# Patient Record
Sex: Female | Born: 1960 | Race: White | Hispanic: No | Marital: Married | Smoking: Former smoker
Health system: Southern US, Community
[De-identification: ages and names within clinical notes are randomized; demographics above are authoritative.]

## PROBLEM LIST (undated history)

## (undated) DIAGNOSIS — F32A Depression, unspecified: Secondary | ICD-10-CM

## (undated) DIAGNOSIS — F419 Anxiety disorder, unspecified: Secondary | ICD-10-CM

## (undated) DIAGNOSIS — E039 Hypothyroidism, unspecified: Secondary | ICD-10-CM

## (undated) DIAGNOSIS — I499 Cardiac arrhythmia, unspecified: Secondary | ICD-10-CM

## (undated) DIAGNOSIS — F329 Major depressive disorder, single episode, unspecified: Secondary | ICD-10-CM

## (undated) DIAGNOSIS — M1990 Unspecified osteoarthritis, unspecified site: Secondary | ICD-10-CM

---

## 2000-09-18 ENCOUNTER — Emergency Department (HOSPITAL_COMMUNITY): Admission: EM | Admit: 2000-09-18 | Discharge: 2000-09-18 | Payer: Self-pay | Admitting: Emergency Medicine

## 2001-08-13 ENCOUNTER — Encounter: Payer: Self-pay | Admitting: Family Medicine

## 2001-08-13 ENCOUNTER — Ambulatory Visit (HOSPITAL_COMMUNITY): Admission: RE | Admit: 2001-08-13 | Discharge: 2001-08-13 | Payer: Self-pay | Admitting: Family Medicine

## 2006-12-21 ENCOUNTER — Ambulatory Visit (HOSPITAL_COMMUNITY): Admission: RE | Admit: 2006-12-21 | Discharge: 2006-12-21 | Payer: Self-pay | Admitting: Family Medicine

## 2007-02-28 HISTORY — PX: CARDIAC CATHETERIZATION: SHX172

## 2007-05-17 ENCOUNTER — Observation Stay (HOSPITAL_COMMUNITY): Admission: AD | Admit: 2007-05-17 | Discharge: 2007-05-18 | Payer: Self-pay | Admitting: Cardiovascular Disease

## 2007-05-31 ENCOUNTER — Ambulatory Visit (HOSPITAL_COMMUNITY): Payer: Self-pay | Admitting: Oncology

## 2007-05-31 ENCOUNTER — Encounter (HOSPITAL_COMMUNITY): Admission: RE | Admit: 2007-05-31 | Discharge: 2007-06-30 | Payer: Self-pay | Admitting: Oncology

## 2008-09-08 ENCOUNTER — Ambulatory Visit (HOSPITAL_COMMUNITY): Admission: RE | Admit: 2008-09-08 | Discharge: 2008-09-08 | Payer: Self-pay | Admitting: Family Medicine

## 2008-09-09 ENCOUNTER — Ambulatory Visit (HOSPITAL_COMMUNITY): Admission: RE | Admit: 2008-09-09 | Discharge: 2008-09-09 | Payer: Self-pay | Admitting: Family Medicine

## 2008-09-16 ENCOUNTER — Ambulatory Visit (HOSPITAL_COMMUNITY): Admission: RE | Admit: 2008-09-16 | Discharge: 2008-09-16 | Payer: Self-pay | Admitting: Family Medicine

## 2010-07-12 NOTE — Cardiovascular Report (Signed)
Teresa Charles, Teresa Charles                ACCOUNT NO.:  [id number]   MEDICAL RECORD NO.:  [id number]          PATIENT TYPE:  INP   LOCATION:  6526                         FACILITY:  [facility]   PHYSICIAN:  Nanetta Batty, M.D.   DATE OF BIRTH:  [birth year]   DATE OF PROCEDURE:  DATE OF DISCHARGE:                            CARDIAC CATHETERIZATION   RESULTS:  Teresa Charles is a 50 year old moderately overweight married  white female with history of tobacco abuse who was admitted this morning  with symptoms compatible with unstable angina.  She had no acute changes  on her EKG.  She presents now for diagnostic coronary arteriography to  define her anatomy and rule out an ischemic etiology.   DESCRIPTION OF PROCEDURE:  The patient was brought to the second floor  [place] cardiac cath lab in the postabsorptive state.  She was  premedicated with p.o. Valium.  Her right groin was prepped and draped  in the usual sterile fashion, and 1% Xylocaine was used for local  anesthesia.  A 6-French sheath was inserted into the right femoral  artery using standard Seldinger technique.  A 6-French right and left  Judkins diagnostic catheter as well as a 6-French pigtail catheter were  used for selective coronary angiography and left ventriculography  respectively.  Visipaque dye was used for the entirety of the case.  Retrograde aorta, left ventricular blood pressures were recorded.   HEMODYNAMICS:  1. Aortic systolic pressure 146, diastolic pressure 76.  2. Left ventricular systolic pressure 153, end-diastolic pressure 13.   SELECTIVE CORONARY ANGIOGRAPHY:  1. Left main normal.  2. LAD normal.  3. Left circumflex normal.  4. Right coronary artery was dominant and normal.   LEFT VENTRICULOGRAPHY:  RAO left ventriculogram was performed using 25  mL of Visipaque dye at 12 mL per second.  The overall LVEF was estimated  at greater than 60% without focal wall motion abnormalities.   IMPRESSION:  [name] has essentially normal coronaries, normal LV  function.  I believe her chest pain is noncardiac.  A D-dimer was  ordered.  Empiric antireflux therapy will be recommended.  The patient  will be discharged home in the morning.  She left the lab in stable  condition.      Nanetta Batty, M.D.  Electronically Signed     JB/MEDQ  D:  05/17/2007  T:  05/17/2007  Job:  [id number]   cc:   [facility]  Dr. Geanie Cooley

## 2010-10-24 ENCOUNTER — Other Ambulatory Visit (HOSPITAL_COMMUNITY): Payer: Self-pay | Admitting: Family Medicine

## 2010-10-24 ENCOUNTER — Ambulatory Visit (HOSPITAL_COMMUNITY)
Admission: RE | Admit: 2010-10-24 | Discharge: 2010-10-24 | Disposition: A | Discharge status: Home / Self Care | Payer: BC Managed Care – PPO | Source: Ambulatory Visit | Attending: Family Medicine | Admitting: Family Medicine

## 2010-10-24 DIAGNOSIS — S92919A Unspecified fracture of unspecified toe(s), initial encounter for closed fracture: Secondary | ICD-10-CM | POA: Insufficient documentation

## 2010-10-24 DIAGNOSIS — T148XXA Other injury of unspecified body region, initial encounter: Secondary | ICD-10-CM

## 2010-10-24 DIAGNOSIS — X58XXXA Exposure to other specified factors, initial encounter: Secondary | ICD-10-CM | POA: Insufficient documentation

## 2010-10-24 DIAGNOSIS — M25579 Pain in unspecified ankle and joints of unspecified foot: Secondary | ICD-10-CM | POA: Insufficient documentation

## 2010-11-21 LAB — CBC
HCT: 42.2
Hemoglobin: 14.2
MCHC: 33.6
MCV: 90.9
MCV: 91.4
Platelets: 224
RBC: 4.65
RDW: 14.3
WBC: 17 — ABNORMAL HIGH

## 2010-11-21 LAB — DIFFERENTIAL
Basophils Relative: 0
Eosinophils Absolute: 0.2
Eosinophils Relative: 1
Monocytes Absolute: 0.5
Monocytes Relative: 4
Neutrophils Relative %: 76

## 2010-11-21 LAB — COMPREHENSIVE METABOLIC PANEL
ALT: 14
AST: 17
Albumin: 3.3 — ABNORMAL LOW
Calcium: 9.1
Creatinine, Ser: 0.56
GFR calc Af Amer: >60
Sodium: 139
Total Protein: 6.1

## 2010-11-21 LAB — AMYLASE: Amylase: 51

## 2010-11-21 LAB — LIPID PANEL
Cholesterol: 181
HDL: 29 — ABNORMAL LOW
Total CHOL/HDL Ratio: 6.2

## 2010-11-21 LAB — HEPARIN LEVEL (UNFRACTIONATED): Heparin Unfractionated: <0.1 — ABNORMAL LOW

## 2010-11-21 LAB — URINALYSIS, MICROSCOPIC ONLY
Glucose, UA: NEGATIVE
Ketones, ur: NEGATIVE
Protein, ur: NEGATIVE

## 2010-11-21 LAB — CARDIAC PANEL(CRET KIN+CKTOT+MB+TROPI)
CK, MB: 0.6
Relative Index: INVALID
Relative Index: INVALID
Total CK: 32
Troponin I: 0.01
Troponin I: 0.01

## 2010-11-21 LAB — HCG, SERUM, QUALITATIVE: Preg, Serum: NEGATIVE

## 2010-11-21 LAB — T4, FREE: Free T4: 1.01

## 2010-11-22 LAB — DIFFERENTIAL
Basophils Absolute: 0.1
Lymphocytes Relative: 22
Monocytes Absolute: 0.6
Neutro Abs: 10.6 — ABNORMAL HIGH
Neutrophils Relative %: 72

## 2010-11-22 LAB — CBC
Hemoglobin: 14.7
RBC: 4.65
RDW: 14.4

## 2010-12-15 ENCOUNTER — Other Ambulatory Visit (HOSPITAL_COMMUNITY): Payer: Self-pay | Admitting: Family Medicine

## 2010-12-15 DIAGNOSIS — Z139 Encounter for screening, unspecified: Secondary | ICD-10-CM

## 2010-12-16 ENCOUNTER — Ambulatory Visit (HOSPITAL_COMMUNITY)
Admission: RE | Admit: 2010-12-16 | Discharge: 2010-12-16 | Disposition: A | Discharge status: Home / Self Care | Payer: BC Managed Care – PPO | Source: Ambulatory Visit | Attending: Family Medicine | Admitting: Family Medicine

## 2010-12-16 DIAGNOSIS — Z139 Encounter for screening, unspecified: Secondary | ICD-10-CM

## 2010-12-16 DIAGNOSIS — Z1231 Encounter for screening mammogram for malignant neoplasm of breast: Secondary | ICD-10-CM | POA: Insufficient documentation

## 2011-08-28 ENCOUNTER — Other Ambulatory Visit (HOSPITAL_COMMUNITY): Payer: Self-pay | Admitting: Family Medicine

## 2011-08-28 ENCOUNTER — Ambulatory Visit (HOSPITAL_COMMUNITY)
Admission: RE | Admit: 2011-08-28 | Discharge: 2011-08-28 | Disposition: A | Discharge status: Home / Self Care | Payer: BC Managed Care – PPO | Source: Ambulatory Visit | Attending: Family Medicine | Admitting: Family Medicine

## 2011-08-28 DIAGNOSIS — Q766 Other congenital malformations of ribs: Secondary | ICD-10-CM

## 2011-08-28 DIAGNOSIS — W19XXXA Unspecified fall, initial encounter: Secondary | ICD-10-CM | POA: Insufficient documentation

## 2011-08-28 DIAGNOSIS — M549 Dorsalgia, unspecified: Secondary | ICD-10-CM

## 2011-08-28 DIAGNOSIS — S2239XA Fracture of one rib, unspecified side, initial encounter for closed fracture: Secondary | ICD-10-CM | POA: Insufficient documentation

## 2011-08-28 DIAGNOSIS — R079 Chest pain, unspecified: Secondary | ICD-10-CM | POA: Insufficient documentation

## 2012-01-29 ENCOUNTER — Other Ambulatory Visit (HOSPITAL_COMMUNITY): Payer: Self-pay | Admitting: Family Medicine

## 2012-01-29 DIAGNOSIS — Z139 Encounter for screening, unspecified: Secondary | ICD-10-CM

## 2012-02-05 ENCOUNTER — Ambulatory Visit (HOSPITAL_COMMUNITY)
Admission: RE | Admit: 2012-02-05 | Discharge: 2012-02-05 | Disposition: A | Discharge status: Home / Self Care | Payer: BC Managed Care – PPO | Source: Ambulatory Visit | Attending: Family Medicine | Admitting: Family Medicine

## 2012-02-05 DIAGNOSIS — Z139 Encounter for screening, unspecified: Secondary | ICD-10-CM

## 2012-02-05 DIAGNOSIS — Z1231 Encounter for screening mammogram for malignant neoplasm of breast: Secondary | ICD-10-CM | POA: Insufficient documentation

## 2013-02-10 ENCOUNTER — Other Ambulatory Visit (HOSPITAL_COMMUNITY): Payer: Self-pay | Admitting: Family Medicine

## 2013-02-10 DIAGNOSIS — Z139 Encounter for screening, unspecified: Secondary | ICD-10-CM

## 2013-02-17 ENCOUNTER — Ambulatory Visit (HOSPITAL_COMMUNITY)
Admission: RE | Admit: 2013-02-17 | Discharge: 2013-02-17 | Disposition: A | Discharge status: Home / Self Care | Payer: PRIVATE HEALTH INSURANCE | Source: Ambulatory Visit | Attending: Family Medicine | Admitting: Family Medicine

## 2013-02-17 DIAGNOSIS — Z139 Encounter for screening, unspecified: Secondary | ICD-10-CM

## 2013-02-17 DIAGNOSIS — Z1231 Encounter for screening mammogram for malignant neoplasm of breast: Secondary | ICD-10-CM | POA: Insufficient documentation

## 2013-11-24 ENCOUNTER — Encounter (HOSPITAL_COMMUNITY): Payer: Self-pay | Admitting: Pharmacy Technician

## 2013-11-27 NOTE — Patient Instructions (Addendum)
Teresa Charles  11/27/2013                           YOUR PROCEDURE IS SCHEDULED ON: 12/08/13               ENTER THRU [facility] MAIN HOSPITAL ENTRANCE AND                            FOLLOW  SIGNS TO SHORT STAY CENTER                 ARRIVE AT SHORT STAY AT:  5:00 AM               CALL THIS NUMBER IF ANY PROBLEMS THE DAY OF SURGERY :               [phone]                                REMEMBER:   Do not eat food or drink liquids AFTER MIDNIGHT                  Take these medicines the morning of surgery with               A SIPS OF WATER :      Metoprolol, Levothyroxine, Wellbutrin   Do not wear jewelry, make-up   Do not wear lotions, powders, or perfumes.   Do not shave legs or underarms 12 hrs. before surgery (men may shave face)  Do not bring valuables to the hospital.  Contacts, dentures or bridgework may not be worn into surgery.  Leave suitcase in the car. After surgery it may be brought to your room.  For patients admitted to the hospital more than one night, checkout time is            11:00 AM                                                       ________________________________________________________________________                                                                                                   - PREPARING FOR SURGERY  Before surgery, you can play an important role.  Because skin is not sterile, your skin needs to be as free of germs as possible.  You can reduce the number of germs on your skin by washing with CHG (chlorahexidine gluconate) soap before surgery.  CHG is an antiseptic cleaner which kills germs and bonds with the skin to continue killing germs even after washing. Please DO NOT use if you have an allergy to CHG or antibacterial soaps.  If your skin becomes reddened/irritated stop using the CHG and inform your nurse when you  arrive at Short Stay. Do not shave (including legs and underarms) for at least 48  hours prior to the first CHG shower.  You may shave your face. Please follow these instructions carefully:   1.  Shower with CHG Soap the night before surgery and the  morning of Surgery.   2.  If you choose to wash your hair, wash your hair first as usual with your  normal  Shampoo.   3.  After you shampoo, rinse your hair and body thoroughly to remove the  shampoo.                                         4.  Use CHG as you would any other liquid soap.  You can apply chg directly  to the skin and wash . Gently wash with scrungie or clean wascloth    5.  Apply the CHG Soap to your body ONLY FROM THE NECK DOWN.   Do not use on open                           Wound or open sores. Avoid contact with eyes, ears mouth and genitals (private parts).                        Genitals (private parts) with your normal soap.              6.  Wash thoroughly, paying special attention to the area where your surgery  will be performed.   7.  Thoroughly rinse your body with warm water from the neck down.   8.  DO NOT shower/wash with your normal soap after using and rinsing off  the CHG Soap .                9.  Pat yourself dry with a clean towel.             10.  Wear clean pajamas.             11.  Place clean sheets on your bed the night of your first shower and do not  sleep with pets.  Day of Surgery : Do not apply any lotions/deodorants the morning of surgery.  Please wear clean clothes to the hospital/surgery center.  FAILURE TO FOLLOW THESE INSTRUCTIONS MAY RESULT IN THE CANCELLATION OF YOUR SURGERY    PATIENT SIGNATURE_________________________________  ______________________________________________________________________     Rogelia MireIncentive Spirometer  An incentive spirometer is a tool that can help keep your lungs clear and active. This tool measures how well you are filling your lungs with each breath. Taking long deep breaths may help reverse or decrease the chance of developing  breathing (pulmonary) problems (especially infection) following:  A long period of time when you are unable to move or be active. BEFORE THE PROCEDURE   If the spirometer includes an indicator to show your best effort, your nurse or respiratory therapist will set it to a desired goal.  If possible, sit up straight or lean slightly forward. Try not to slouch.  Hold the incentive spirometer in an upright position. INSTRUCTIONS FOR USE  1. Sit on the edge of your bed if possible, or sit up as far as you can in bed or on a chair. 2. Hold the incentive spirometer in an upright position.  3. Breathe out normally. 4. Place the mouthpiece in your mouth and seal your lips tightly around it. 5. Breathe in slowly and as deeply as possible, raising the piston or the ball toward the top of the column. 6. Hold your breath for 3-5 seconds or for as long as possible. Allow the piston or ball to fall to the bottom of the column. 7. Remove the mouthpiece from your mouth and breathe out normally. 8. Rest for a few seconds and repeat Steps 1 through 7 at least 10 times every 1-2 hours when you are awake. Take your time and take a few normal breaths between deep breaths. 9. The spirometer may include an indicator to show your best effort. Use the indicator as a goal to work toward during each repetition. 10. After each set of 10 deep breaths, practice coughing to be sure your lungs are clear. If you have an incision (the cut made at the time of surgery), support your incision when coughing by placing a pillow or rolled up towels firmly against it. Once you are able to get out of bed, walk around indoors and cough well. You may stop using the incentive spirometer when instructed by your caregiver.  RISKS AND COMPLICATIONS  Take your time so you do not get dizzy or light-headed.  If you are in pain, you may need to take or ask for pain medication before doing incentive spirometry. It is harder to take a deep  breath if you are having pain. AFTER USE  Rest and breathe slowly and easily.  It can be helpful to keep track of a log of your progress. Your caregiver can provide you with a simple table to help with this. If you are using the spirometer at home, follow these instructions: SEEK MEDICAL CARE IF:   You are having difficultly using the spirometer.  You have trouble using the spirometer as often as instructed.  Your pain medication is not giving enough relief while using the spirometer.  You develop fever of 100.5 F (38.1 C) or higher. SEEK IMMEDIATE MEDICAL CARE IF:   You cough up bloody sputum that had not been present before.  You develop fever of 102 F (38.9 C) or greater.  You develop worsening pain at or near the incision site. MAKE SURE YOU:   Understand these instructions.  Will watch your condition.  Will get help right away if you are not doing well or get worse. Document Released: 06/26/2006 Document Revised: 05/08/2011 Document Reviewed: 08/27/2006 Exit[employer] Patient Information 2014 Exit[employer], [place].   ________________________________________________________________________  WHAT IS A BLOOD TRANSFUSION? Blood Transfusion Information  A transfusion is the replacement of blood or some of its parts. Blood is made up of multiple cells which provide different functions.  Red blood cells carry oxygen and are used for blood loss replacement.  White blood cells fight against infection.  Platelets control bleeding.  Plasma helps clot blood.  Other blood products are available for specialized needs, such as hemophilia or other clotting disorders. BEFORE THE TRANSFUSION  Who gives blood for transfusions?   Healthy volunteers who are fully evaluated to make sure their blood is safe. This is blood bank blood. Transfusion therapy is the safest it has ever been in the practice of medicine. Before blood is taken from a donor, a complete history is taken to make sure  that person has no history of diseases nor engages in risky social behavior (examples are intravenous drug use or sexual activity with multiple partners).  The donor's travel history is screened to minimize risk of transmitting infections, such as malaria. The donated blood is tested for signs of infectious diseases, such as HIV and hepatitis. The blood is then tested to be sure it is compatible with you in order to minimize the chance of a transfusion reaction. If you or a relative donates blood, this is often done in anticipation of surgery and is not appropriate for emergency situations. It takes many days to process the donated blood. RISKS AND COMPLICATIONS Although transfusion therapy is very safe and saves many lives, the main dangers of transfusion include:   Getting an infectious disease.  Developing a transfusion reaction. This is an allergic reaction to something in the blood you were given. Every precaution is taken to prevent this. The decision to have a blood transfusion has been considered carefully by your caregiver before blood is given. Blood is not given unless the benefits outweigh the risks. AFTER THE TRANSFUSION  Right after receiving a blood transfusion, you will usually feel much better and more energetic. This is especially true if your red blood cells have gotten low (anemic). The transfusion raises the level of the red blood cells which carry oxygen, and this usually causes an energy increase.  The nurse administering the transfusion will monitor you carefully for complications. HOME CARE INSTRUCTIONS  No special instructions are needed after a transfusion. You may find your energy is better. Speak with your caregiver about any limitations on activity for underlying diseases you may have. SEEK MEDICAL CARE IF:   Your condition is not improving after your transfusion.  You develop redness or irritation at the intravenous (IV) site. SEEK IMMEDIATE MEDICAL CARE IF:  Any of  the following symptoms occur over the next 12 hours:  Shaking chills.  You have a temperature by mouth above 102 F (38.9 C), not controlled by medicine.  Chest, back, or muscle pain.  People around you feel you are not acting correctly or are confused.  Shortness of breath or difficulty breathing.  Dizziness and fainting.  You get a rash or develop hives.  You have a decrease in urine output.  Your urine turns a dark color or changes to pink, red, or brown. Any of the following symptoms occur over the next 10 days:  You have a temperature by mouth above 102 F (38.9 C), not controlled by medicine.  Shortness of breath.  Weakness after normal activity.  The white part of the eye turns yellow (jaundice).  You have a decrease in the amount of urine or are urinating less often.  Your urine turns a dark color or changes to pink, red, or brown. Document Released: 02/11/2000 Document Revised: 05/08/2011 Document Reviewed: 09/30/2007 [facility] Patient Information 2014 [place], [place].  _______________________________________________________________________

## 2013-11-28 ENCOUNTER — Ambulatory Visit (HOSPITAL_COMMUNITY)
Admission: RE | Admit: 2013-11-28 | Discharge: 2013-11-28 | Disposition: A | Discharge status: Home / Self Care | Payer: PRIVATE HEALTH INSURANCE | Source: Ambulatory Visit | Attending: Anesthesiology | Admitting: Anesthesiology

## 2013-11-28 ENCOUNTER — Encounter (HOSPITAL_COMMUNITY): Payer: Self-pay

## 2013-11-28 ENCOUNTER — Encounter (HOSPITAL_COMMUNITY)
Admission: RE | Admit: 2013-11-28 | Discharge: 2013-11-28 | Disposition: A | Discharge status: Home / Self Care | Payer: PRIVATE HEALTH INSURANCE | Source: Ambulatory Visit | Attending: Orthopedic Surgery | Admitting: Orthopedic Surgery

## 2013-11-28 DIAGNOSIS — I10 Essential (primary) hypertension: Secondary | ICD-10-CM | POA: Diagnosis not present

## 2013-11-28 DIAGNOSIS — F17200 Nicotine dependence, unspecified, uncomplicated: Secondary | ICD-10-CM | POA: Insufficient documentation

## 2013-11-28 DIAGNOSIS — Z01818 Encounter for other preprocedural examination: Secondary | ICD-10-CM | POA: Insufficient documentation

## 2013-11-28 HISTORY — DX: Hypothyroidism, unspecified: E03.9

## 2013-11-28 HISTORY — DX: Anxiety disorder, unspecified: F41.9

## 2013-11-28 HISTORY — DX: Depression, unspecified: F32.A

## 2013-11-28 HISTORY — DX: Major depressive disorder, single episode, unspecified: F32.9

## 2013-11-28 HISTORY — DX: Unspecified osteoarthritis, unspecified site: M19.90

## 2013-11-28 HISTORY — DX: Cardiac arrhythmia, unspecified: I49.9

## 2013-11-28 LAB — CBC
HEMATOCRIT: 46.5 % — AB (ref 36.0–46.0)
Hemoglobin: 15.2 g/dL — ABNORMAL HIGH (ref 12.0–15.0)
MCH: 30.5 pg (ref 26.0–34.0)
MCHC: 32.7 g/dL (ref 30.0–36.0)
MCV: 93.2 fL (ref 78.0–100.0)
Platelets: 211 10*3/uL (ref 150–400)
RBC: 4.99 MIL/uL (ref 3.87–5.11)
RDW: 13.4 % (ref 11.5–15.5)
WBC: 12.1 10*3/uL — AB (ref 4.0–10.5)

## 2013-11-28 LAB — BASIC METABOLIC PANEL
ANION GAP: 10 (ref 5–15)
BUN: 12 mg/dL (ref 6–23)
CHLORIDE: 104 meq/L (ref 96–112)
CO2: 27 meq/L (ref 19–32)
Calcium: 9.5 mg/dL (ref 8.4–10.5)
Creatinine, Ser: 0.59 mg/dL (ref 0.50–1.10)
GFR calc Af Amer: >90 mL/min (ref >90)
GFR calc non Af Amer: >90 mL/min (ref >90)
GLUCOSE: 85 mg/dL (ref 70–99)
Potassium: 4.2 mEq/L (ref 3.7–5.3)
SODIUM: 141 meq/L (ref 137–147)

## 2013-11-28 LAB — URINALYSIS, ROUTINE W REFLEX MICROSCOPIC
Bilirubin Urine: NEGATIVE
Glucose, UA: NEGATIVE mg/dL
Ketones, ur: NEGATIVE mg/dL
Leukocytes, UA: NEGATIVE
NITRITE: NEGATIVE
PH: 6 (ref 5.0–8.0)
Protein, ur: NEGATIVE mg/dL
SPECIFIC GRAVITY, URINE: 1.008 (ref 1.005–1.030)
Urobilinogen, UA: 0.2 mg/dL (ref 0.0–1.0)

## 2013-11-28 LAB — SURGICAL PCR SCREEN
MRSA, PCR: NEGATIVE
STAPHYLOCOCCUS AUREUS: NEGATIVE

## 2013-11-28 LAB — PROTIME-INR
INR: 1.03 (ref 0.00–1.49)
Prothrombin Time: 13.6 seconds (ref 11.6–15.2)

## 2013-11-28 LAB — URINE MICROSCOPIC-ADD ON

## 2013-11-28 LAB — APTT: aPTT: 30 seconds (ref 24–37)

## 2013-11-30 NOTE — H&P (Signed)
TOTAL KNEE ADMISSION H&P  Patient is being admitted for left total knee arthroplasty.  Subjective:  Chief Complaint:    Left knee OA / pain.  HPI: Teresa Charles, 53 y.o. female, has a history of pain and functional disability in the left knee due to arthritis and has failed non-surgical conservative treatments for greater than 12 weeks to includeNSAID's and/or analgesics, use of assistive devices and activity modification.  Onset of symptoms was gradual, starting >10 years ago with gradually worsening course since that time. The patient noted no past surgery on the left knee(s).  Patient currently rates pain in the left knee(s) at 10 out of 10 with activity. Patient has worsening of pain with activity and weight bearing, pain that interferes with activities of daily living, pain with passive range of motion, crepitus and joint swelling.  Patient has evidence of periarticular osteophytes and joint space narrowing by imaging studies.  There is no active infection.  Risks, benefits and expectations were discussed with the patient.  Risks including but not limited to the risk of anesthesia, blood clots, nerve damage, blood vessel damage, failure of the prosthesis, infection and up to and including death.  Patient understand the risks, benefits and expectations and wishes to proceed with surgery.   PCP: Kirk RuthsMCGOUGH,WILLIAM M, MD  D/C Plans:      Home with HHPT  Post-op Meds:       No Rx given  Tranexamic Acid:      To be given - IV    Decadron:      Is to be given  FYI:     ASA post-op  Oxycodone post-op    Past Medical History  Diagnosis Date  . Dysrhythmia     palpitations  . Hypothyroidism   . Anxiety   . Depression   . Arthritis     Past Surgical History  Procedure Laterality Date  . Cardiac catheterization  2009    No prescriptions prior to admission   No Known Allergies   History  Substance Use Topics  . Smoking status: Current Every Day Smoker -- 1.00 packs/day for 35 years     Types: Cigarettes  . Smokeless tobacco: Not on file  . Alcohol Use: No       Review of Systems  Constitutional: Negative.   HENT: Negative.   Eyes: Negative.   Respiratory: Negative.   Cardiovascular: Negative.   Gastrointestinal: Negative.   Genitourinary: Negative.   Musculoskeletal: Positive for joint pain.  Skin: Negative.   Neurological: Negative.   Endo/Heme/Allergies: Negative.   Psychiatric/Behavioral: Positive for depression. The patient is nervous/anxious.     Objective:  Physical Exam  Constitutional: She is oriented to person, place, and time. She appears well-developed and well-nourished.  HENT:  Head: Normocephalic and atraumatic.  Eyes: Pupils are equal, round, and reactive to light.  Neck: Neck supple. No JVD present. No tracheal deviation present. No thyromegaly present.  Cardiovascular: Normal rate, regular rhythm, normal heart sounds and intact distal pulses.   Respiratory: Effort normal and breath sounds normal. No respiratory distress. She has no wheezes.  GI: Soft. There is no tenderness. There is no guarding.  Musculoskeletal:       Left knee: She exhibits decreased range of motion, swelling, abnormal alignment (genu varum) and bony tenderness. She exhibits no ecchymosis, no deformity, no laceration and no erythema. Tenderness found.  Lymphadenopathy:    She has no cervical adenopathy.  Neurological: She is alert and oriented to person, place, and time.  Skin: Skin is warm and dry.  Psychiatric: She has a normal mood and affect.     Imaging Review Plain radiographs demonstrate severe degenerative joint disease of the left knee(s). The overall alignment is significant varus. The bone quality appears to be good for age and reported activity level.  Assessment/Plan:  End stage arthritis, left knee   The patient history, physical examination, clinical judgment of the provider and imaging studies are consistent with end stage degenerative joint  disease of the left knee(s) and total knee arthroplasty is deemed medically necessary. The treatment options including medical management, injection therapy arthroscopy and arthroplasty were discussed at length. The risks and benefits of total knee arthroplasty were presented and reviewed. The risks due to aseptic loosening, infection, stiffness, patella tracking problems, thromboembolic complications and other imponderables were discussed. The patient acknowledged the explanation, agreed to proceed with the plan and consent was signed. Patient is being admitted for inpatient treatment for surgery, pain control, PT, OT, prophylactic antibiotics, VTE prophylaxis, progressive ambulation and ADL's and discharge planning. The patient is planning to be discharged home with home health services.     Anastasio Auerbach Tashea Othman   PA-C  11/30/2013, 3:04 PM

## 2013-12-07 ENCOUNTER — Encounter (HOSPITAL_COMMUNITY): Payer: Self-pay | Admitting: Anesthesiology

## 2013-12-07 NOTE — Anesthesia Preprocedure Evaluation (Addendum)
Anesthesia Evaluation  Patient identified by MRN, date of birth, ID band Patient awake    Reviewed: Allergy & Precautions, H&P , NPO status , Patient's Chart, lab work & pertinent test results  Airway Mallampati: II TM Distance: >3 FB Neck ROM: Full    Dental no notable dental hx.    Pulmonary Current Smoker,  breath sounds clear to auscultation  Pulmonary exam normal       Cardiovascular + dysrhythmias Rhythm:Regular Rate:Normal     Neuro/Psych PSYCHIATRIC DISORDERS Anxiety Depression negative neurological ROS     GI/Hepatic negative GI ROS, Neg liver ROS,   Endo/Other  Hypothyroidism   Renal/GU negative Renal ROS  negative genitourinary   Musculoskeletal  (+) Arthritis -,   Abdominal   Peds negative pediatric ROS (+)  Hematology negative hematology ROS (+)   Anesthesia Other Findings   Reproductive/Obstetrics negative OB ROS                          Anesthesia Physical Anesthesia Plan  ASA: II  Anesthesia Plan: Spinal   Post-op Pain Management:    Induction: Intravenous  Airway Management Planned:   Additional Equipment:   Intra-op Plan:   Post-operative Plan: Extubation in OR  Informed Consent: I have reviewed the patients History and Physical, chart, labs and discussed the procedure including the risks, benefits and alternatives for the proposed anesthesia with the patient or authorized representative who has indicated his/her understanding and acceptance.   Dental advisory given  Plan Discussed with: CRNA  Anesthesia Plan Comments: (Discussed spinal and general anesthesia with femoral nerve block. She prefers spinal. Discussed risks/benefits of spinal including headache, backache, failure, bleeding, infection, and nerve damage. Patient consents to spinal. Questions answered. Coagulation studies and platelet count acceptable.)       Anesthesia Quick Evaluation

## 2013-12-08 ENCOUNTER — Ambulatory Visit (HOSPITAL_COMMUNITY)
Admission: RE | Admit: 2013-12-08 | Discharge: 2013-12-08 | Disposition: A | Discharge status: Home / Self Care | Payer: No Typology Code available for payment source | Source: Ambulatory Visit | Attending: Orthopedic Surgery | Admitting: Orthopedic Surgery

## 2013-12-08 ENCOUNTER — Encounter (HOSPITAL_COMMUNITY): Payer: Self-pay | Admitting: *Deleted

## 2013-12-08 ENCOUNTER — Ambulatory Visit (HOSPITAL_COMMUNITY): Payer: No Typology Code available for payment source | Admitting: Anesthesiology

## 2013-12-08 ENCOUNTER — Encounter (HOSPITAL_COMMUNITY): Payer: No Typology Code available for payment source | Admitting: Anesthesiology

## 2013-12-08 ENCOUNTER — Encounter (HOSPITAL_COMMUNITY)
Admission: RE | Disposition: A | Discharge status: Home / Self Care | Payer: Self-pay | Source: Ambulatory Visit | Attending: Orthopedic Surgery

## 2013-12-08 DIAGNOSIS — F419 Anxiety disorder, unspecified: Secondary | ICD-10-CM | POA: Insufficient documentation

## 2013-12-08 DIAGNOSIS — M179 Osteoarthritis of knee, unspecified: Secondary | ICD-10-CM | POA: Diagnosis not present

## 2013-12-08 DIAGNOSIS — F329 Major depressive disorder, single episode, unspecified: Secondary | ICD-10-CM | POA: Diagnosis not present

## 2013-12-08 DIAGNOSIS — F17210 Nicotine dependence, cigarettes, uncomplicated: Secondary | ICD-10-CM | POA: Insufficient documentation

## 2013-12-08 DIAGNOSIS — E039 Hypothyroidism, unspecified: Secondary | ICD-10-CM | POA: Insufficient documentation

## 2013-12-08 DIAGNOSIS — Z96652 Presence of left artificial knee joint: Secondary | ICD-10-CM

## 2013-12-08 HISTORY — PX: TOTAL KNEE ARTHROPLASTY: SHX125

## 2013-12-08 LAB — TYPE AND SCREEN
ABO/RH(D): O NEG
Antibody Screen: NEGATIVE

## 2013-12-08 LAB — ABO/RH: ABO/RH(D): O NEG

## 2013-12-08 SURGERY — ARTHROPLASTY, KNEE, TOTAL
Anesthesia: Spinal | Site: Knee | Laterality: Left

## 2013-12-08 MED ORDER — OXYCODONE HCL 5 MG PO TABS
15.0000 mg | ORAL_TABLET | ORAL | Status: DC | PRN
  Administered 2013-12-08: 15 mg via ORAL
  Filled 2013-12-08: qty 3

## 2013-12-08 MED ORDER — METHOCARBAMOL 500 MG PO TABS
500.0000 mg | ORAL_TABLET | Freq: Four times a day (QID) | ORAL | Status: DC | PRN
  Administered 2013-12-08: 500 mg via ORAL
  Filled 2013-12-08: qty 1

## 2013-12-08 MED ORDER — POLYETHYLENE GLYCOL 3350 17 G PO PACK: 17.0000 g | PACK | Freq: Every day | ORAL | Status: DC

## 2013-12-08 MED ORDER — ONDANSETRON HCL 4 MG/2ML IJ SOLN
INTRAMUSCULAR | Status: AC
  Filled 2013-12-08: qty 2

## 2013-12-08 MED ORDER — OXYCODONE HCL 5 MG PO TABS: 5.0000 mg | ORAL_TABLET | ORAL | Status: DC | PRN

## 2013-12-08 MED ORDER — DEXAMETHASONE SODIUM PHOSPHATE 10 MG/ML IJ SOLN
10.0000 mg | Freq: Once | INTRAMUSCULAR | Status: AC
  Administered 2013-12-08: 10 mg via INTRAVENOUS

## 2013-12-08 MED ORDER — SODIUM CHLORIDE 0.9 % IJ SOLN
INTRAMUSCULAR | Status: AC
  Filled 2013-12-08: qty 10

## 2013-12-08 MED ORDER — SODIUM CHLORIDE 0.9 % IR SOLN
Status: DC | PRN
  Administered 2013-12-08: 1000 mL

## 2013-12-08 MED ORDER — PROPOFOL 10 MG/ML IV BOLUS
INTRAVENOUS | Status: AC
  Filled 2013-12-08: qty 20

## 2013-12-08 MED ORDER — DOCUSATE SODIUM 100 MG PO CAPS: 100.0000 mg | ORAL_CAPSULE | Freq: Two times a day (BID) | ORAL | Status: DC

## 2013-12-08 MED ORDER — CEPHALEXIN 500 MG PO CAPS: 500.0000 mg | ORAL_CAPSULE | Freq: Three times a day (TID) | ORAL | Status: DC

## 2013-12-08 MED ORDER — HYDROMORPHONE HCL 1 MG/ML IJ SOLN
0.2500 mg | INTRAMUSCULAR | Status: DC | PRN
  Administered 2013-12-08 (×2): 0.5 mg via INTRAVENOUS

## 2013-12-08 MED ORDER — ASPIRIN EC 325 MG PO TBEC: 325.0000 mg | DELAYED_RELEASE_TABLET | Freq: Two times a day (BID) | ORAL | Status: AC

## 2013-12-08 MED ORDER — PROPOFOL INFUSION 10 MG/ML OPTIME
INTRAVENOUS | Status: DC | PRN
Start: 2013-12-08 — End: 2013-12-08
  Administered 2013-12-08: 80 ug/kg/min via INTRAVENOUS

## 2013-12-08 MED ORDER — CEFAZOLIN SODIUM-DEXTROSE 2-3 GM-% IV SOLR
2.0000 g | INTRAVENOUS | Status: AC
Start: 2013-12-08 — End: 2013-12-08
  Administered 2013-12-08: 2 g via INTRAVENOUS

## 2013-12-08 MED ORDER — 0.9 % SODIUM CHLORIDE (POUR BTL) OPTIME
TOPICAL | Status: DC | PRN
  Administered 2013-12-08: 1000 mL

## 2013-12-08 MED ORDER — SODIUM CHLORIDE 0.9 % IJ SOLN
INTRAMUSCULAR | Status: DC | PRN
  Administered 2013-12-08: 9 mL via INTRAVENOUS

## 2013-12-08 MED ORDER — PROMETHAZINE HCL 25 MG/ML IJ SOLN: 6.2500 mg | INTRAMUSCULAR | Status: DC | PRN

## 2013-12-08 MED ORDER — BUPIVACAINE-EPINEPHRINE (PF) 0.25% -1:200000 IJ SOLN
INTRAMUSCULAR | Status: AC
  Filled 2013-12-08: qty 30

## 2013-12-08 MED ORDER — FERROUS SULFATE 325 (65 FE) MG PO TABS: 325.0000 mg | ORAL_TABLET | Freq: Every day | ORAL | Status: DC

## 2013-12-08 MED ORDER — BUPIVACAINE-EPINEPHRINE (PF) 0.25% -1:200000 IJ SOLN
INTRAMUSCULAR | Status: DC | PRN
  Administered 2013-12-08: 30 mL

## 2013-12-08 MED ORDER — MIDAZOLAM HCL 2 MG/2ML IJ SOLN
INTRAMUSCULAR | Status: AC
  Filled 2013-12-08: qty 2

## 2013-12-08 MED ORDER — FENTANYL CITRATE 0.05 MG/ML IJ SOLN
INTRAMUSCULAR | Status: DC | PRN
  Administered 2013-12-08: 100 ug via INTRAVENOUS

## 2013-12-08 MED ORDER — FENTANYL CITRATE 0.05 MG/ML IJ SOLN
INTRAMUSCULAR | Status: AC
  Filled 2013-12-08: qty 2

## 2013-12-08 MED ORDER — DEXAMETHASONE SODIUM PHOSPHATE 10 MG/ML IJ SOLN
INTRAMUSCULAR | Status: AC
  Filled 2013-12-08: qty 1

## 2013-12-08 MED ORDER — KETOROLAC TROMETHAMINE 30 MG/ML IJ SOLN
INTRAMUSCULAR | Status: DC | PRN
  Administered 2013-12-08: 30 mg via INTRAVENOUS

## 2013-12-08 MED ORDER — METHOCARBAMOL 500 MG PO TABS: 500.0000 mg | ORAL_TABLET | Freq: Four times a day (QID) | ORAL | Status: DC | PRN

## 2013-12-08 MED ORDER — MIDAZOLAM HCL 5 MG/5ML IJ SOLN
INTRAMUSCULAR | Status: DC | PRN
  Administered 2013-12-08: 2 mg via INTRAVENOUS

## 2013-12-08 MED ORDER — KETOROLAC TROMETHAMINE 30 MG/ML IJ SOLN
INTRAMUSCULAR | Status: AC
  Filled 2013-12-08: qty 1

## 2013-12-08 MED ORDER — LACTATED RINGERS IV SOLN
INTRAVENOUS | Status: DC | PRN
  Administered 2013-12-08 (×2): via INTRAVENOUS

## 2013-12-08 MED ORDER — HYDROMORPHONE HCL 1 MG/ML IJ SOLN
INTRAMUSCULAR | Status: AC
  Filled 2013-12-08: qty 1

## 2013-12-08 MED ORDER — TRANEXAMIC ACID 100 MG/ML IV SOLN
1000.0000 mg | Freq: Once | INTRAVENOUS | Status: AC
  Administered 2013-12-08: 1000 mg via INTRAVENOUS
  Filled 2013-12-08: qty 10

## 2013-12-08 MED ORDER — BUPIVACAINE LIPOSOME 1.3 % IJ SUSP
20.0000 mL | Freq: Once | INTRAMUSCULAR | Status: AC
  Administered 2013-12-08: 20 mL
  Filled 2013-12-08: qty 20

## 2013-12-08 MED ORDER — ONDANSETRON HCL 4 MG/2ML IJ SOLN
INTRAMUSCULAR | Status: DC | PRN
  Administered 2013-12-08: 4 mg via INTRAVENOUS

## 2013-12-08 MED ORDER — CEFAZOLIN SODIUM-DEXTROSE 2-3 GM-% IV SOLR
INTRAVENOUS | Status: AC
  Filled 2013-12-08: qty 50

## 2013-12-08 MED ORDER — BUPIVACAINE HCL (PF) 0.75 % IJ SOLN
INTRAMUSCULAR | Status: DC | PRN
  Administered 2013-12-08: 15 mg

## 2013-12-08 SURGICAL SUPPLY — 49 items
BAG ZIPLOCK 12X15 (MISCELLANEOUS) IMPLANT
BANDAGE ELASTIC 6 VELCRO ST LF (GAUZE/BANDAGES/DRESSINGS) ×3 IMPLANT
BANDAGE ESMARK 6X9 LF (GAUZE/BANDAGES/DRESSINGS) ×1 IMPLANT
BLADE SAW SGTL 13.0X1.19X90.0M (BLADE) ×3 IMPLANT
BNDG ESMARK 6X9 LF (GAUZE/BANDAGES/DRESSINGS) ×3
BOWL SMART MIX CTS (DISPOSABLE) ×3 IMPLANT
CAP KNEE ATTUNE RP ×3 IMPLANT
CEMENT HV SMART SET (Cement) ×6 IMPLANT
CUFF TOURN SGL QUICK 34 (TOURNIQUET CUFF) ×2
CUFF TRNQT CYL 34X4X40X1 (TOURNIQUET CUFF) ×1 IMPLANT
DERMABOND ADVANCED (GAUZE/BANDAGES/DRESSINGS) ×2
DERMABOND ADVANCED .7 DNX12 (GAUZE/BANDAGES/DRESSINGS) ×1 IMPLANT
DRAPE EXTREMITY TIBURON (DRAPES) ×3 IMPLANT
DRAPE POUCH INSTRU U-SHP 10X18 (DRAPES) ×3 IMPLANT
DRAPE U-SHAPE 47X51 STRL (DRAPES) ×3 IMPLANT
DRSG AQUACEL AG ADV 3.5X10 (GAUZE/BANDAGES/DRESSINGS) ×3 IMPLANT
DURAPREP 26ML APPLICATOR (WOUND CARE) ×6 IMPLANT
ELECT REM PT RETURN 9FT ADLT (ELECTROSURGICAL) ×3
ELECTRODE REM PT RTRN 9FT ADLT (ELECTROSURGICAL) ×1 IMPLANT
FACESHIELD WRAPAROUND (MASK) ×12 IMPLANT
GLOVE BIOGEL PI IND STRL 7.5 (GLOVE) ×1 IMPLANT
GLOVE BIOGEL PI IND STRL 8.5 (GLOVE) ×1 IMPLANT
GLOVE BIOGEL PI INDICATOR 7.5 (GLOVE) ×2
GLOVE BIOGEL PI INDICATOR 8.5 (GLOVE) ×2
GLOVE ECLIPSE 8.0 STRL XLNG CF (GLOVE) ×3 IMPLANT
GLOVE ORTHO TXT STRL SZ7.5 (GLOVE) ×6 IMPLANT
GOWN SPEC L3 XXLG W/TWL (GOWN DISPOSABLE) ×3 IMPLANT
GOWN STRL REUS W/TWL LRG LVL3 (GOWN DISPOSABLE) ×3 IMPLANT
HANDPIECE INTERPULSE COAX TIP (DISPOSABLE) ×2
KIT BASIN OR (CUSTOM PROCEDURE TRAY) ×3 IMPLANT
MANIFOLD NEPTUNE II (INSTRUMENTS) ×3 IMPLANT
NDL SAFETY ECLIPSE 18X1.5 (NEEDLE) ×1 IMPLANT
NEEDLE HYPO 18GX1.5 SHARP (NEEDLE) ×2
PACK TOTAL JOINT (CUSTOM PROCEDURE TRAY) ×3 IMPLANT
POSITIONER SURGICAL ARM (MISCELLANEOUS) ×3 IMPLANT
SET HNDPC FAN SPRY TIP SCT (DISPOSABLE) ×1 IMPLANT
SET PAD KNEE POSITIONER (MISCELLANEOUS) ×3 IMPLANT
SUCTION FRAZIER 12FR DISP (SUCTIONS) ×3 IMPLANT
SUT MNCRL AB 4-0 PS2 18 (SUTURE) ×3 IMPLANT
SUT VIC AB 1 CT1 36 (SUTURE) ×3 IMPLANT
SUT VIC AB 2-0 CT1 27 (SUTURE) ×6
SUT VIC AB 2-0 CT1 TAPERPNT 27 (SUTURE) ×3 IMPLANT
SUT VLOC 180 0 24IN GS25 (SUTURE) ×3 IMPLANT
SYR 50ML LL SCALE MARK (SYRINGE) ×3 IMPLANT
TOWEL OR 17X26 10 PK STRL BLUE (TOWEL DISPOSABLE) ×3 IMPLANT
TOWEL OR NON WOVEN STRL DISP B (DISPOSABLE) IMPLANT
TRAY FOLEY CATH 14FRSI W/METER (CATHETERS) ×3 IMPLANT
WATER STERILE IRR 1500ML POUR (IV SOLUTION) ×3 IMPLANT
WRAP KNEE MAXI GEL POST OP (GAUZE/BANDAGES/DRESSINGS) ×3 IMPLANT

## 2013-12-08 NOTE — Progress Notes (Addendum)
Patient is motivated to go home after left total knee replacement today. Rhonda from Case Management notified. Patient is to have [facility] at home for Physical Therapy. Notified [facility] to deliver bedside commode to short stay. Physical Therapy is notified. Patient had spinal anesthesia and still has numbness around buttocks. Physical Therapy to call back about 1 PM to see if patient is ready for ambulation. Husband is sent to out patient pharmacy to fill all prescriptions. Patient is to be on aspirin 325 mg BID for 4 weeks and to wear ted hose during the day and take them off at night.  Patient refused walker from [facility] as it had a 20$ copay. She states she has borrowed a walker and BSC from a friend and has them at home.

## 2013-12-08 NOTE — Progress Notes (Signed)
CARE MANAGEMENT NOTE 12/08/2013  Patient:  Teresa Charles,[name]   Account Number:  [id number]  Date Initiated:  12/08/2013  Documentation initiated by:  Itamar Mcgowan  Subjective/Objective Assessment:   pt had left total knee [id number]     Action/Plan:   home today after 1330-had spinal anesthesia   Anticipated DC Date:  12/08/2013   Anticipated DC Plan:  HOME W HOME HEALTH SERVICES  In-house referral  NA      DC Planning Services  CM consult      PAC Choice  NA   Choice offered to / List presented to:  [name]   DME arranged  [facility]an HurstWALKER - RO[facility]      DME agency  [facility]     HH arranged  [facility]   Status of service:  Completed, signed off Medicare Important Message given?   (If response is "NO", the following Medicare IM given date fields will be blank) Date Medicare IM given:   Medicare IM given by:   Date Additional Medicare IM given:   Additional Medicare IM given by:    Discharge Disposition:  HOME W HOME HEALTH SERVICES  Per UR Regulation:  Reviewed for med. necessity/level of care/duration of stay  If discussed at Long Length of Stay Meetings, dates discussed:    Comments:  [id number]/Erman Thum Earlene Plateravis, RN, BSN, CCM Chart reviewed. Discharge needs and patient's stay to be reviewed and followed by case manager. [name] is fresh left total knee [id number] admittede as op/states that she has her son who is a N.T. to help her at home/has a 3 in one/roller walker ordered by s.s. staff Bannie/spoke with dorectly with patient encouraged her to reconsider going home today to wait and see how pain is after spinal wears off/will require physical therapy evaluation ac discharge. [facility]ct [facility] will contact patient

## 2013-12-08 NOTE — Anesthesia Procedure Notes (Signed)
Spinal  Patient location during procedure: OR Start time: 12/08/2013 7:20 AM End time: 12/08/2013 7:23 AM Staffing CRNA/Resident: Carmelia RollerALDAY, Samarth Ogle R Performed by: resident/CRNA  Preanesthetic Checklist Completed: patient identified, site marked, surgical consent, pre-op evaluation, timeout performed, IV checked, risks and benefits discussed and monitors and equipment checked Spinal Block Patient position: sitting Prep: Betadine Patient monitoring: heart rate, cardiac monitor, continuous pulse ox and blood pressure Approach: midline Location: L3-4 Injection technique: single-shot Needle Needle type: Spinocan  Needle gauge: 24 G Needle length: 9 cm Needle insertion depth: 6 cm Assessment Sensory level: T6

## 2013-12-08 NOTE — Op Note (Signed)
NAME:  Teresa Charles                      MEDICAL RECORD NO.:  [id number]                             [facility]      PHYSICIAN:  Madlyn FrankelMatthew D. Charlann Boxerlin, M.D.  DATE OF BIRTH:  08/11/1960      DATE OF PROCEDURE:  12/08/2013                                     OPERATIVE REPORT         PREOPERATIVE DIAGNOSIS:  Left knee osteoarthritis.      POSTOPERATIVE DIAGNOSIS:  Left knee osteoarthritis.      FINDINGS:  The patient was noted to have complete loss of cartilage and   bone-on-bone arthritis with associated osteophytes in the medial and patellofemoral compartments of   the knee with a significant synovitis and associated effusion.      PROCEDURE:  Left total knee replacement.      COMPONENTS USED:  DePuy Attunerotating platform posterior stabilized knee   system, a size 7 femur, 7 tibia, size 6 mm PS AOXinsert, and 38 anatomic patellar   button.      SURGEON:  Madlyn FrankelMatthew D. Charlann Boxerlin, M.D.      ASSISTANT:  Lanney GinsMatthew Babish, PA-C.      ANESTHESIA:  Spinal.      SPECIMENS:  None.      COMPLICATION:  None.      DRAINS:  None.  EBL: <150cc      TOURNIQUET TIME:   Total Tourniquet Time Documented: Thigh (Left) - 32 minutes Total: Thigh (Left) - 32 minutes  .      The patient was stable to the recovery room.      INDICATION FOR PROCEDURE:  Teresa Asalam E Batzel is a 53 y.o. female patient of   mine.  The patient had been seen, evaluated, and treated conservatively in the   office with medication, activity modification, and injections.  The patient had   radiographic changes of bone-on-bone arthritis with endplate sclerosis and osteophytes noted.      The patient failed conservative measures including medication, injections, and activity modification, and at this point was ready for more definitive measures.   Based on the radiographic changes and failed conservative measures, the patient   decided to proceed with total knee replacement.  Risks of infection,   DVT, component failure,  need for revision surgery, postop course, and   expectations were all   discussed and reviewed.  Consent was obtained for benefit of pain   relief.      PROCEDURE IN DETAIL:  The patient was brought to the operative theater.   Once adequate anesthesia, preoperative antibiotics, 2 gm of Ancef administered, the patient was positioned supine with the left thigh tourniquet placed.  The  left lower extremity was prepped and draped in sterile fashion.  A time-   out was performed identifying the patient, planned procedure, and   extremity.      The left lower extremity [facility].  The leg was   exsanguinated, tourniquet elevated to 250 mmHg.  A midline incision was   made followed by median parapatellar arthrotomy.  Following initial   exposure, attention  was first directed to the patella.  Precut   measurement was noted to be 23 mm.  I resected down to 14 mm and used a   38 anatomic patellar button to restore patellar height as well as cover the cut   surface.      The lug holes were drilled and a metal shim was placed to protect the   patella from retractors and saw blades.      At this point, attention was now directed to the femur.  The femoral   canal was opened with a drill, irrigated to try to prevent fat emboli.  An   intramedullary rod was passed at 3 degrees valgus, 9 mm of bone was   resected off the distal femur.  Following this resection, the tibia was   subluxated anteriorly.  Using the extramedullary guide, 9 mm of bone was resected off   the proximal lateral tibia.  We confirmed the gap would be   stable medially and laterally with a 6 mm insert as well as confirmed   the cut was perpendicular in the coronal plane, checking with an alignment rod.      Once this was done, I sized the femur to be a size 7 in the anterior-   posterior dimension, chose a standard component based on medial and   lateral dimension.  The size 7 rotation block was then  pinned in   position anterior referenced using the tensioning device to set rotation and help match extension and flexion gaps.  The   anterior, posterior, and  chamfer cuts were made without difficulty nor   notching making certain that I was along the anterior cortex to help   with flexion gap stability.      The final box cut was made off the lateral aspect of distal femur.      At this point, the tibia was sized to be a size 7, the size 7 tray was   then pinned in position through the medial third of the tubercle,   drilled, and keel punched.  Trial reduction was now carried with a 7 femur,  7 tibia, a 6 mm insert, and the 38 patella botton.  The knee was brought to   extension, full extension with good flexion stability with the patella   tracking through the trochlea without application of pressure.  Given   all these findings, the trial components removed.  Final components were   opened and cement was mixed.  The knee was irrigated with normal saline   solution and pulse lavage.  The synovial lining was   then injected with 20cc of Exparel, 30cc of 0.25% Marcaine with epinephrine and 1 cc of Toradol,   total of 61 cc.      The knee was irrigated.  Final implants were then cemented onto clean and   dried cut surfaces of bone with the knee brought to extension with a size 6   mm trial insert.      Once the cement had fully cured, the excess cement was removed   throughout the knee.  I confirmed I was satisfied with the range of   motion and stability, and the final size 6 mm PS AOX insert was chosen.  It was   placed into the knee.      The tourniquet had been let down at 32 minutes.  No significant   hemostasis required.  The   extensor mechanism was then reapproximated using #  1 Vicryl and #0 V-lockwith the knee   in flexion.  The   remaining wound was closed with 2-0 Vicryl and running 4-0 Monocryl.   The knee was cleaned, dried, dressed sterilely using Dermabond and    Aquacel dressing.  The patient was then   brought to recovery room in stable condition, tolerating the procedure   well.   Please note that Physician Assistant, Lanney GinsMatthew Babish, PA-C, was present for the entirety of the case, and was utilized for pre-operative positioning, peri-operative retractor management, general facilitation of the procedure.  He was also utilized for primary wound closure at the end of the case.              Madlyn FrankelMatthew D. Charlann Boxerlin, M.D.    12/08/2013 8:41 AM

## 2013-12-08 NOTE — Interval H&P Note (Signed)
History and Physical Interval Note:  12/08/2013 7:05 AM  Teresa Charles  has presented today for surgery, with the diagnosis of left knee osteoarthritis  The various methods of treatment have been discussed with the patient and family. After consideration of risks, benefits and other options for treatment, the patient has consented to  Procedure(s): LEFT TOTAL KNEE ARTHROPLASTY (Left) as a surgical intervention .  The patient's history has been reviewed, patient examined, no change in status, stable for surgery.  I have reviewed the patient's chart and labs.  Questions were answered to the patient's satisfaction.     Shelda PalLIN,Lakeem Rozo D

## 2013-12-08 NOTE — Addendum Note (Signed)
Addendum created 12/08/13 1116 by Doran ClayStephen R Evanell Redlich, CRNA   Modules edited: Anesthesia Medication Administration

## 2013-12-08 NOTE — Discharge Instructions (Signed)
Spinal Anesthesia °Care After °HOME CARE INSTRUCTIONS °· Do not drive or operate machinery for at least 24 hours after receiving anesthesia. Make sure someone is available to drive you home. °· Do not drink alcohol for at least 24 hours after receiving anesthesia. °· Do not make important decisions for 24 hours after having spinal or epidural anesthesia. Your thinking may be unclear. °· Have someone stay with you for at least 24 to 48 hours following surgery. °· Drink lots of fluids when you get home. If you are an adult, drink eight, 8 ounce glasses of water per day, or as directed. °· Keep your post-operative appointments as suggested. °SEEK IMMEDIATE MEDICAL CARE IF:  °· You develop a fever or any temperature over 100.4° F (38° C). °· You have a persistent or severe headache. °· You develop blurred or double vision. °· You develop dizziness, fainting or lightheadedness. °· You have weakness, numbness, or tingling in your arms or legs. °· You develop a skin rash. °· You have difficulty breathing. °· You have persistent nausea and vomiting. °· You are unable to pass urine. °Document Released: 05/06/2003 Document Revised: 05/08/2011 Document Reviewed: 03/19/2007 °Exit[employer]® Patient Information ©2015 [employer]. This information is not intended to replace advice given to you by your health care provider. Make sure you discuss any questions you have with your health care provider. ° °

## 2013-12-08 NOTE — Transfer of Care (Signed)
Immediate Anesthesia Transfer of Care Note  Patient: Teresa Charles  Procedure(s) Performed: Procedure(s): LEFT TOTAL KNEE ARTHROPLASTY (Left)  Patient Location: PACU  Anesthesia Type:Spinal  Level of Consciousness: sedated  Airway & Oxygen Therapy: Patient Spontanous Breathing and Patient connected to face mask oxygen  Post-op Assessment: Report given to PACU RN and Post -op Vital signs reviewed and stable  Post vital signs: Reviewed and stable  Complications: No apparent anesthesia complications

## 2013-12-08 NOTE — Anesthesia Postprocedure Evaluation (Signed)
  Anesthesia Post-op Note  Patient: Teresa Charles  Procedure(s) Performed: Procedure(s) (LRB): LEFT TOTAL KNEE ARTHROPLASTY (Left)  Patient Location: PACU  Anesthesia Type: Spinal  Level of Consciousness: awake and alert   Airway and Oxygen Therapy: Patient Spontanous Breathing  Post-op Pain: mild  Post-op Assessment: Post-op Vital signs reviewed, Patient's Cardiovascular Status Stable, Respiratory Function Stable, Patent Airway and No signs of Nausea or vomiting  Last Vitals:  Filed Vitals:   12/08/13 1024  BP:   Pulse: 58  Temp:   Resp: 16    Post-op Vital Signs: stable   Complications: No apparent anesthesia complications

## 2013-12-08 NOTE — Progress Notes (Signed)
Foley catheter removed without difficulty- 400 cc yellow-colored urine in bag

## 2013-12-08 NOTE — Evaluation (Signed)
Physical Therapy Evaluation Patient Details Name: Teresa Charles MRN: [id number] DOB: Aug 25, 1960 Today's Date: 12/08/2013   History of Present Illness  s/p L TKA   Clinical Impression  Pt doing well for POD 0 after LTKA. Has quad strength and good range returning, and physical mobility as well. Did have great conversation at length on safety and how to slow ly and safely progress at home. Pt a bit impulsive and had to educate a lot with safety in all mobility aspects. Feel pt safe to return home today with family assisting and with home health following up with pt as soon as they can.  Exercise handout and stair handout given, and emphasized breathing incentive spirometer, and quad set/ankle pumps for circulation as well.     Follow Up Recommendations Home health PT    Equipment Recommendations  Rolling walker with 5" wheels (pt states she has a RW at home ?)    Recommendations for Other Services       Precautions / Restrictions Precautions Precautions: Knee Precaution Comments: educated pt a lot on safety, knee precautions and positioning Restrictions Weight Bearing Restrictions: No (WBAT)      Mobility  Bed Mobility Overal bed mobility: Modified Independent             General bed mobility comments: used UEs ato lift L LE onto bed  Transfers Overall transfer level: Needs assistance Equipment used: Rolling walker (2 wheeled) Transfers: Sit to/from Stand Sit to Stand: Supervision         General transfer comment: cues for safety with LLE and hand placment on chair and RW   Ambulation/Gait Ambulation/Gait assistance: Supervision Ambulation Distance (Feet): 70 Feet (then another 50 feet ) Assistive device: Rolling walker (2 wheeled) Gait Pattern/deviations: Step-to pattern     General Gait Details: cues for step to pattern and safety of not getting to close to fron of RW for balance and safety reasons. Also the need to use the RW at this time.    Stairs Stairs: Yes Stairs assistance: Min guard Stair Management: One rail Right Number of Stairs: 3 (performed twice) General stair comments: reviewed and perfromed twice, pt states husband and son able to assist. Also gave handout to pt .   Wheelchair Mobility    Modified Rankin (Stroke Patients Only)       Balance                                             Pertinent Vitals/Pain Pain Assessment: 0-10 Pain Score: 3  Pain Location: L knee area Pain Descriptors / Indicators: Aching Pain Intervention(s): Premedicated before session;Ice applied    Home Living Family/patient expects to be discharged to:: Private residence Living Arrangements: Spouse/significant other Available Help at Discharge: Family Type of Home: House Home Access: Stairs to enter Entrance Stairs-Rails: None (there are 2 other entrances witha rail also) Entrance Stairs-Number of Steps: 3-4 Home Layout: One level Home Equipment: [place] - 2 wheels;Wheelchair - manual      Prior Function Level of Independence: Independent         Comments: works as Quarry manager at [employer]        Extremity/Trunk Assessment               Lower Extremity Assessment: LLE deficits/detail   LLE Deficits / Details: pt able to perform  quad set, and SLR , as well as 0-90 knee flexion in supine and sitting EOB     Communication   Communication: No difficulties  Cognition Arousal/Alertness: Awake/alert Behavior During Therapy: WFL for tasks assessed/performed Overall Cognitive Status: Within Functional Limits for tasks assessed                      General Comments      Exercises Total Joint Exercises Ankle Circles/Pumps: AROM;Both;10 reps;Supine Quad Sets: AROM;Left;10 reps;Supine Heel Slides: AROM;Left;10 reps;Supine Hip ABduction/ADduction: AROM;Supine;Left;10 reps Straight Leg Raises: AROM;Left;10 reps Goniometric ROM: 0-90      Assessment/Plan     PT Assessment All further PT needs can be met in the next venue of care  PT Diagnosis Difficulty walking   PT Problem List Decreased strength;Decreased range of motion;Decreased activity tolerance;Decreased mobility;Decreased knowledge of use of DME  PT Treatment Interventions     PT Goals (Current goals can be found in the Care Plan section) Acute Rehab PT Goals PT Goal Formulation: No goals set, d/c therapy    Frequency     Barriers to discharge        Co-evaluation               End of Session Equipment Utilized During Treatment: Gait belt Activity Tolerance: Patient tolerated treatment well Patient left: in bed;with call bell/phone within reach (educated pt greatly on attention to [place] nd to not get up by herself, to use the RW with someone assisting if she wants  to get up and to take it slow and attentive to what she is doing. ) Nurse Communication: Mobility status    Functional Assessment Tool Used: Clinical Judgement Functional Limitation: Mobility: Walking and moving around Mobility: Walking and Moving Around Current Status ([id number]): At least 1 percent but less than 20 percent impaired, limited or restricted Mobility: Walking and Moving Around Goal Status [phone]): At least 1 percent but less than 20 percent impaired, limited or restricted Mobility: Walking and Moving Around Discharge Status [phone]): At least 1 percent but less than 20 percent impaired, limited or restricted    Time: 1388-7195 PT Time Calculation (min): 77 min   Charges:   PT Evaluation $Initial PT Evaluation Tier I: 1 Procedure PT Treatments $Gait Training: 23-37 mins $Therapeutic Exercise: 23-37 mins $Therapeutic Activity: 8-22 mins   PT G Codes:   Functional Assessment Tool Used: Clinical Judgement Functional Limitation: Mobility: Walking and moving around    [place], [facility] 12/08/2013, 3:20 PM Clide Dales, PT Pager: [phone] 12/08/2013

## 2013-12-09 ENCOUNTER — Encounter (HOSPITAL_COMMUNITY): Payer: Self-pay | Admitting: Orthopedic Surgery

## 2013-12-09 NOTE — Care Management Note (Signed)
    Page 1 of 2   12/08/2013     12:05:20 PM CARE MANAGEMENT NOTE 12/08/2013  Patient:  Teresa Charles,Teresa Charles   Account Number:  [id number]  Date Initiated:  12/08/2013  Documentation initiated by:  DAVIS,RHONDA  Subjective/Objective Assessment:   pt had left total knee [id number]     Action/Plan:   home today after 1330-had spinal anesthesia   Anticipated DC Date:  12/08/2013   Anticipated DC Plan:  HOME W HOME HEALTH SERVICES  In-house referral  NA      DC Planning Services  CM consult      PAC Choice  NA   Choice offered to / List presented to:  [name]   DME arranged  [facility]      DME agency  [facility]     HH arranged  HH-2 PT      [facility]   Status of service:  Completed, signed off Medicare Important Message given?   (If response is "NO", the following Medicare IM given date fields will be blank) Date Medicare IM given:   Medicare IM given by:   Date Additional Medicare IM given:   Additional Medicare IM given by:    Discharge Disposition:  HOME W HOME HEALTH SERVICES  Per UR Regulation:  Reviewed for med. necessity/level of care/duration of stay  If discussed at Long Length of Stay Meetings, dates discussed:    Comments:  [id number]/Rhonda Earlene Plateravis, RN, BSN, CCM Chart reviewed. Discharge needs and patient's stay to be reviewed and followed by case manager. [name] is fresh left total knee [phone] admittede as op/states that she has her son who is a N.T. to help her at home/has a 3 in one/roller walker ordered by s.s. staff Lianne/spoke with dorectly with patient encouraged her to reconsider going home today to wait and see how pain is after spinal wears off/will require physical therapy evaluation ac discharge.

## 2013-12-18 ENCOUNTER — Ambulatory Visit (HOSPITAL_COMMUNITY)
Admission: RE | Admit: 2013-12-18 | Discharge: 2013-12-18 | Disposition: A | Discharge status: Home / Self Care | Payer: PRIVATE HEALTH INSURANCE | Source: Ambulatory Visit | Attending: Physician Assistant | Admitting: Physician Assistant

## 2013-12-18 ENCOUNTER — Other Ambulatory Visit (HOSPITAL_COMMUNITY): Payer: Self-pay | Admitting: Physician Assistant

## 2013-12-18 DIAGNOSIS — Z96659 Presence of unspecified artificial knee joint: Secondary | ICD-10-CM | POA: Diagnosis not present

## 2013-12-18 DIAGNOSIS — R05 Cough: Secondary | ICD-10-CM | POA: Insufficient documentation

## 2013-12-18 DIAGNOSIS — R0600 Dyspnea, unspecified: Secondary | ICD-10-CM

## 2013-12-18 MED ORDER — IOHEXOL 350 MG/ML SOLN
100.0000 mL | Freq: Once | INTRAVENOUS | Status: AC | PRN
  Administered 2013-12-18: 100 mL via INTRAVENOUS

## 2013-12-30 ENCOUNTER — Ambulatory Visit (HOSPITAL_COMMUNITY)
Admission: RE | Admit: 2013-12-30 | Discharge: 2013-12-30 | Disposition: A | Discharge status: Home / Self Care | Payer: No Typology Code available for payment source | Source: Ambulatory Visit | Attending: Physician Assistant | Admitting: Physician Assistant

## 2013-12-30 ENCOUNTER — Encounter (HOSPITAL_COMMUNITY): Payer: Self-pay | Admitting: Physical Therapy

## 2013-12-30 DIAGNOSIS — M25662 Stiffness of left knee, not elsewhere classified: Secondary | ICD-10-CM | POA: Insufficient documentation

## 2013-12-30 DIAGNOSIS — Z471 Aftercare following joint replacement surgery: Secondary | ICD-10-CM | POA: Diagnosis not present

## 2013-12-30 DIAGNOSIS — M6281 Muscle weakness (generalized): Secondary | ICD-10-CM | POA: Insufficient documentation

## 2013-12-30 DIAGNOSIS — M25562 Pain in left knee: Secondary | ICD-10-CM | POA: Diagnosis not present

## 2013-12-30 DIAGNOSIS — M1712 Unilateral primary osteoarthritis, left knee: Secondary | ICD-10-CM

## 2013-12-30 DIAGNOSIS — R262 Difficulty in walking, not elsewhere classified: Secondary | ICD-10-CM

## 2013-12-30 DIAGNOSIS — R29898 Other symptoms and signs involving the musculoskeletal system: Secondary | ICD-10-CM

## 2013-12-30 DIAGNOSIS — Z96652 Presence of left artificial knee joint: Secondary | ICD-10-CM | POA: Insufficient documentation

## 2013-12-30 NOTE — Therapy (Signed)
Physical Therapy Evaluation  Patient Details  Name: Teresa Charles MRN: [id number] Date of Birth: [birth year]  Encounter Date: 12/30/2013      PT End of Session - 12/30/13 1838    Visit Number 1   Number of Visits 16   Date for PT Re-Evaluation 01/29/14   Authorization Type [employer]   Authorization Time Period 30 visit limit   Authorization - Visit Number 1   Authorization - Number of Visits 16   PT Start Time 1609   PT Stop Time 1649   PT Time Calculation (min) 40 min   Activity Tolerance Patient tolerated treatment well      Past Medical History  Diagnosis Date  . Dysrhythmia     palpitations  . Hypothyroidism   . Anxiety   . Depression   . Arthritis     Past Surgical History  Procedure Laterality Date  . Cardiac catheterization  2009  . Total knee arthroplasty Left 12/08/2013    Procedure: LEFT TOTAL KNEE ARTHROPLASTY;  Surgeon: Shelda PalMatthew D Olin, MD;  Location: [facility];  Service: Orthopedics;  Laterality: Left;    There were no vitals taken for this visit.  Visit Diagnosis:  Primary osteoarthritis of left knee  Knee stiffness, left  Knee pain, left  Difficulty walking  Weakness of left lower extremity      Subjective Assessment - 12/30/13 1829    Symptoms pain predominantly over incision and posterior knee   Pertinent History 12/08/13 patient had Lt knee replacement. 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 Patient has noted difficulty sleeping and has not slept greater than 3 hours. Patient has to be bale to perfom a lot of liftign as well.    Patient Stated Goals to be able to wal > 30minutes and lift 30lb from low surface to return to work   Currently in Pain? Yes   Pain Score 5    Pain Location Knee   Pain Orientation Left   Pain Descriptors / Indicators Dull;Aching;Sharp   Pain Type Surgical pain   Pain Radiating Towards towards ankle   Pain Onset 1 to 4 weeks ago   Pain Frequency Constant   Aggravating Factors  prolonged sittign in  unelevated position   Pain Relieving Factors Pain medications, ice   Effect of Pain on Daily Activities difficulty walking, sitting and performing all ADL's and IADL's           [facility] - 12/30/13 1800    Assessment   Medical Diagnosis Lt knee stiffness, weakness and pain s/p Lt Knee replacement.    Next MD Visit [name] and [name] 01/20/14   Prior Therapy Home health therapy: [name] was great.    Observation/Other Assessments   Observations Walking: Lt knee stiffness resulting in abnormal stride length on Lt, bilateral early heel rise, and limited hip ROM.    Focus on Therapeutic Outcomes (FOTO)  63% limited   AROM   Left Hip Flexion 125   Left Hip External Rotation  32   Left Hip Internal Rotation  40   Left Knee Extension -10   Left Knee Flexion 103   Right Ankle Dorsiflexion 2   Left Ankle Dorsiflexion 2   Strength   Right Hip Flexion 5/5   Left Hip Flexion 4/5   Right Knee Flexion 5/5   Right Knee Extension 5/5   Left Knee Flexion 4/5   Left Knee Extension 4/5   Right Ankle Dorsiflexion 5/5   Left  Ankle Dorsiflexion 4/5   Flexibility   Hamstrings limited   Quadriceps limited   Thomas Test    Findings Negative   Side Left;Right   Ober's Test   Findings Negative   Side Left;Right   Piriformis Test   Findings Negative   Side  Left;Right   other   Findings Positive   Side Right;Left   Comments Ely's test          Adult PT Treatment/Exercise - 12/30/13 0700    Knee/Hip Exercises: Stretches   Active Hamstring Stretch Limitations 3 way hamstring stretch to chair in door way 20xseconds each way.           Education - 12/30/13 1837    Education provided Yes   Education Details patient educated in performance of 3 way hamstring stretch for HEP performance.    Education Details Patient   Methods Explanation;Demonstration;Handout   Comprehension Verbalized understanding;Returned demonstration          PT Short Term Goals - 12/30/13  1848    PT SHORT TERM GOAL #1   Title Patient will display improved Lt knee extension to -5 degrees from full extension to normalize gait   Baseline Knee extension -10 degrees   Time 4   Period Weeks   PT SHORT TERM GOAL #2   Title Patient will displasy imrpoved ankle dorsiflexion to 10 degrees  bilaterally to decrease early heel rise durign gait    Baseline Bilateral ankle dorsiflexion <5 degrees   Time 4   Period Weeks   PT SHORT TERM GOAL #3   Title Patient will demosntrate increased knee flexion to >110 degrees to improve squat depth   Baseline 103 degrees knee flexion on Lt LE   Time 4   Period Weeks          PT Long Term Goals - 12/30/13 1848    PT LONG TERM GOAL #1   Title Patient will display increased knee extension to 0 degrees to normalize stride length   Baseline -10 degrees   Time 8   Period Weeks   PT LONG TERM GOAL #2   Title Patient will dispalsy increased ankle dorsiflexion to >15 degrees to decrease early heel rise durign gait   Baseline <5 degrees bilaterally   Time 8   Period Weeks   PT LONG TERM GOAL #3   Title Patient will display increased knee flexion arom >120 degrees to tormanilze squat depth.    Baseline <105 degrees   Time 8   Period Weeks   PT LONG TERM GOAL #4   Title Patient will dmeosntrate increased knee flexion/extension strength of 5/5 MMT to be able to walk up and downstairs without UE support.   Baseline knee flexion/extension strength of 4/5 MMT   Time 8   Period Weeks          Plan - 12/30/13 1838    Clinical Impression Statement Patient displasy Lt knee stiffness, weakness , and pain follwoign Lt knee TKA resulting in abnormal gait. Patient will benefit from skilel dphsyical therapy to improve Lt knee AROM and strength to normalize gait and return to work as a Psychologist, sport and exercise.    Pt will benefit from skilled therapeutic intervention in order to improve on the following deficits Abnormal gait;Decreased activity tolerance;Decreased  balance;Decreased strength;Difficulty walking;Pain;Impaired flexibility;Increased fascial restricitons;Decreased range of motion;Decreased mobility   Rehab Potential Good   PT Frequency Min 2X/week   PT Duration 8 weeks   PT Treatment/Interventions Gait training;Stair training;Functional  mobility training;Therapeutic activities;Therapeutic exercise;Balance training;Neuromuscular re-education;Modalities;Manual techniques;Patient/family education   PT Plan Primary focus of therapy initially on regaining full knee extension AROM with secondary focus on knee flexion to normalize gait. As ROM improves focus to shift to improving Lt LE strength and stability for gait.         Problem List Patient Active Problem List   Diagnosis Date Noted  . S/P left TKA 12/08/2013   Leata Dominy R 12/30/2013, 6:52 PM

## 2014-01-01 ENCOUNTER — Ambulatory Visit (HOSPITAL_COMMUNITY)
Admission: RE | Admit: 2014-01-01 | Discharge: 2014-01-01 | Disposition: A | Discharge status: Home / Self Care | Payer: No Typology Code available for payment source | Source: Ambulatory Visit | Attending: Family Medicine | Admitting: Family Medicine

## 2014-01-01 ENCOUNTER — Encounter (HOSPITAL_COMMUNITY): Payer: Self-pay | Admitting: Physical Therapy

## 2014-01-01 DIAGNOSIS — M1712 Unilateral primary osteoarthritis, left knee: Secondary | ICD-10-CM

## 2014-01-01 DIAGNOSIS — M25662 Stiffness of left knee, not elsewhere classified: Secondary | ICD-10-CM

## 2014-01-01 DIAGNOSIS — R262 Difficulty in walking, not elsewhere classified: Secondary | ICD-10-CM

## 2014-01-01 DIAGNOSIS — M25562 Pain in left knee: Secondary | ICD-10-CM

## 2014-01-01 DIAGNOSIS — M25669 Stiffness of unspecified knee, not elsewhere classified: Secondary | ICD-10-CM | POA: Insufficient documentation

## 2014-01-01 DIAGNOSIS — Z471 Aftercare following joint replacement surgery: Secondary | ICD-10-CM | POA: Diagnosis not present

## 2014-01-01 NOTE — Therapy (Signed)
Physical Therapy Treatment  Patient Details  Name: Teresa Charles MRN: [id number] Date of Birth: Feb 26, 1961  Encounter Date: 01/01/2014      PT End of Session - 01/01/14 1328    Visit Number 2   Number of Visits 16   Date for PT Re-Evaluation 01/29/14   Authorization Type [employer]   Authorization Time Period 30 visit limit   Authorization - Visit Number 2   Authorization - Number of Visits 16   PT Start Time 1305   PT Stop Time 1345   PT Time Calculation (min) 40 min   Activity Tolerance Patient tolerated treatment well      Past Medical History  Diagnosis Date  . Dysrhythmia     palpitations  . Hypothyroidism   . Anxiety   . Depression   . Arthritis     Past Surgical History  Procedure Laterality Date  . Cardiac catheterization  2009  . Total knee arthroplasty Left 12/08/2013    Procedure: LEFT TOTAL KNEE ARTHROPLASTY;  Surgeon: Shelda PalMatthew D Olin, MD;  Location: [facility];  Service: Orthopedics;  Laterality: Left;    There were no vitals taken for this visit.  Visit Diagnosis:  Primary osteoarthritis of left knee  Knee stiffness, left  Knee pain, left  Difficulty walking        [facility] Adult PT Treatment/Exercise - 01/01/14 1322    Knee/Hip Exercises: Stretches   Active Hamstring Stretch Limitations 3 way hamstring stretch to chair in door way 20xseconds each way.    Quad Stretch 20 seconds;4 reps   Hip Flexor Stretch 4 reps;20 seconds   Knee: Self-Stretch Limitations heel slides in supine 10x 3 second hold   ITB Stretch 4 reps;20 seconds   Gastroc Stretch 4 reps;20 seconds   Knee/Hip Exercises: Standing   Other Standing Knee Exercises 3D hip excursions 10x          Education - 01/01/14 1327    Education Details patient educated on performance of stretces for HEP: quads, hamstrings, ITband, groind, calf and hipflexor stretches   Education Details Patient   Methods Explanation;Demonstration;Handout   Comprehension Verbalized understanding;Returned  demonstration          PT Short Term Goals - 01/01/14 1334    PT SHORT TERM GOAL #1   Title Patient will display improved Lt knee extension to -5 degrees from full extension to normalize gait   PT SHORT TERM GOAL #2   Title Patient will displasy imrpoved ankle dorsiflexion to 10 degrees  bilaterally to decrease early heel rise durign gait    PT SHORT TERM GOAL #3   Title Patient will demosntrate increased knee flexion to >110 degrees to improve squat depth          PT Long Term Goals - 01/01/14 1334    PT LONG TERM GOAL #1   Title Patient will display increased knee extension to 0 degrees to normalize stride length   PT LONG TERM GOAL #2   Title Patient will dispalsy increased ankle dorsiflexion to >15 degrees to decrease early heel rise durign gait   PT LONG TERM GOAL #3   Title Patient will display increased knee flexion arom >120 degrees to tormanilze squat depth.    PT LONG TERM GOAL #4   Title Patient will dmeosntrate increased knee flexion/extension strength of 5/5 MMT to be able to walk up and downstairs without UE support.          Plan - 01/01/14 1330    Clinical  Impression Statement Patient introduced to all LE stretches to begin improvign Rt LE ROm. patient noted pain durign stretches in Lt knee but otherwise felt good.    PT Treatment/Interventions Gait training;Stair training;Functional mobility training;Therapeutic activities;Therapeutic exercise;Balance training;Neuromuscular re-education;Modalities;Manual techniques;Patient/family education   PT Plan Primary focus of therapy initially on regaining full knee extension AROM with secondary focus on knee flexion to normalize gait. As ROM improves focus to shift to improving Lt LE strength and stability for gait.  Next session introduce walking 2D hip excursions.         Problem List Patient Active Problem List   Diagnosis Date Noted  . Primary osteoarthritis of left knee 01/01/2014  . Knee stiffness 01/01/2014   . Knee pain, left 01/01/2014  . Difficulty walking 01/01/2014  . S/P left TKA 12/08/2013       Issa Kosmicki R 01/01/2014, 8:06 PM

## 2014-01-01 NOTE — Patient Instructions (Signed)
Lower Extremity Stretches 2x20 seconds each position  3 way calf stretch                 3 way Hip Stretch         3 way hamstring stretch            2 way Groin stretch       IT Band Stretch

## 2014-01-06 ENCOUNTER — Ambulatory Visit (HOSPITAL_COMMUNITY)
Admission: RE | Admit: 2014-01-06 | Discharge: 2014-01-06 | Disposition: A | Discharge status: Home / Self Care | Payer: No Typology Code available for payment source | Source: Ambulatory Visit | Attending: Family Medicine | Admitting: Family Medicine

## 2014-01-06 DIAGNOSIS — Z471 Aftercare following joint replacement surgery: Secondary | ICD-10-CM | POA: Diagnosis not present

## 2014-01-06 DIAGNOSIS — R262 Difficulty in walking, not elsewhere classified: Secondary | ICD-10-CM

## 2014-01-06 DIAGNOSIS — M25562 Pain in left knee: Secondary | ICD-10-CM

## 2014-01-06 DIAGNOSIS — M25662 Stiffness of left knee, not elsewhere classified: Secondary | ICD-10-CM

## 2014-01-06 DIAGNOSIS — M1712 Unilateral primary osteoarthritis, left knee: Secondary | ICD-10-CM

## 2014-01-06 DIAGNOSIS — R29898 Other symptoms and signs involving the musculoskeletal system: Secondary | ICD-10-CM

## 2014-01-06 NOTE — Therapy (Signed)
Physical Therapy Treatment  Patient Details  Name: Teresa Charles MRN: [id number] Date of Birth: February 28, 1960  Encounter Date: 01/06/2014      PT End of Session - 01/06/14 1239    PT Start Time 1150   PT Stop Time 1230   PT Time Calculation (min) 40 min      Past Medical History  Diagnosis Date  . Dysrhythmia     palpitations  . Hypothyroidism   . Anxiety   . Depression   . Arthritis     Past Surgical History  Procedure Laterality Date  . Cardiac catheterization  2009  . Total knee arthroplasty Left 12/08/2013    Procedure: LEFT TOTAL KNEE ARTHROPLASTY;  Surgeon: Shelda PalMatthew D Olin, MD;  Location: [facility];  Service: Orthopedics;  Laterality: Left;    There were no vitals taken for this visit.  Visit Diagnosis:  Primary osteoarthritis of left knee  Knee stiffness, left  Knee pain, left  Difficulty walking  Weakness of left lower extremity      Subjective Assessment - 01/06/14 1150    Symptoms Pt states she had an episode of severe pain superior Lt knee  following last Thursdays treatment.  States she was yelling in pain and hasnt been able to do much since then.  Pt states she just took 2 pain pills and has 3/10 "mild" pain.   Currently in Pain? Yes   Pain Score 3    Pain Location Knee   Pain Orientation Left            [facility] Adult PT Treatment/Exercise - 01/06/14 0001    Knee/Hip Exercises: Stretches   Active Hamstring Stretch Limitations 3 way hamstring stretch to chair in door way 20xseconds each way.    Hip Flexor Stretch 4 reps;20 seconds   Knee: Self-Stretch to increase Flexion 10 seconds   Knee: Self-Stretch Limitations heel slides in supine 10x 3 second hold   ITB Stretch 4 reps;20 seconds   Gastroc Stretch 4 reps;20 seconds   Knee/Hip Exercises: Standing   Other Standing Knee Exercises 3D hip excursions 10x   Manual Therapy   Massage soft tissue mobilization of ITband, quadraceps, medial hamstring.                Plan - 01/06/14 1237     Clinical Impression Statement Pt with increased complaints of pain following last session; Continued as previous session without adding new actvities.  Focused on manual and instructed in scar massage to decrease adhesions.  Pt reported overall improvment in comfort around knee and decreased itching.  Instructed patient to moisturize around scar to soften skin.  Pt verbalized understanding.  No complaints of pain during session.   PT Treatment/Interventions (ACUTE ONLY) Gait training;Stair training;Functional mobility training;Therapeutic activities;Therapeutic exercise;Balance training;Neuromuscular re-education;Modalities;Manual techniques;Patient/family education   PT Plan Primary focus of therapy initially on regaining full knee extension AROM with secondary focus on knee flexion to normalize gait. As ROM improves focus to shift to improving Lt LE strength and stability for gait.  Next session introduce walking 2D hip excursions.         Problem List Patient Active Problem List   Diagnosis Date Noted  . Primary osteoarthritis of left knee 01/01/2014  . Knee stiffness 01/01/2014  . Knee pain, left 01/01/2014  . Difficulty walking 01/01/2014  . S/P left TKA 12/08/2013            Lurena NidaAmy B Frazier, PTA/CLT 01/06/2014, 12:39 PM

## 2014-01-08 ENCOUNTER — Encounter (HOSPITAL_COMMUNITY): Payer: Self-pay | Admitting: Physical Therapy

## 2014-01-08 ENCOUNTER — Ambulatory Visit (HOSPITAL_COMMUNITY)
Admission: RE | Admit: 2014-01-08 | Discharge: 2014-01-08 | Disposition: A | Discharge status: Home / Self Care | Payer: No Typology Code available for payment source | Source: Ambulatory Visit | Attending: Family Medicine | Admitting: Family Medicine

## 2014-01-08 DIAGNOSIS — Z471 Aftercare following joint replacement surgery: Secondary | ICD-10-CM | POA: Diagnosis not present

## 2014-01-08 DIAGNOSIS — M25562 Pain in left knee: Secondary | ICD-10-CM

## 2014-01-08 DIAGNOSIS — M1712 Unilateral primary osteoarthritis, left knee: Secondary | ICD-10-CM

## 2014-01-08 DIAGNOSIS — M25662 Stiffness of left knee, not elsewhere classified: Secondary | ICD-10-CM

## 2014-01-08 DIAGNOSIS — R262 Difficulty in walking, not elsewhere classified: Secondary | ICD-10-CM

## 2014-01-08 DIAGNOSIS — R29898 Other symptoms and signs involving the musculoskeletal system: Secondary | ICD-10-CM

## 2014-01-08 NOTE — Therapy (Signed)
Physical Therapy Treatment  Patient Details  Name: Teresa Charles MRN: [id number] Date of Birth: December 12, 1960  Encounter Date: 01/08/2014      PT End of Session - 01/08/14 1247    Visit Number 4   Number of Visits 16   Date for PT Re-Evaluation 01/29/14   Authorization Type [employer]   Authorization Time Period 30 visit limit   Authorization - Visit Number 4   Authorization - Number of Visits 16   PT Start Time 1156   PT Stop Time 1235   PT Time Calculation (min) 39 min   Activity Tolerance Patient tolerated treatment well      Past Medical History  Diagnosis Date  . Dysrhythmia     palpitations  . Hypothyroidism   . Anxiety   . Depression   . Arthritis     Past Surgical History  Procedure Laterality Date  . Cardiac catheterization  2009  . Total knee arthroplasty Left 12/08/2013    Procedure: LEFT TOTAL KNEE ARTHROPLASTY;  Surgeon: Shelda PalMatthew D Olin, MD;  Location: [facility];  Service: Orthopedics;  Laterality: Left;    There were no vitals taken for this visit.  Visit Diagnosis:  Knee stiffness, left  Primary osteoarthritis of left knee  Knee pain, left  Difficulty walking  Weakness of left lower extremity      Subjective Assessment - 01/08/14 1242    Symptoms Patient states she has been feeling better but may be a litle sore today as she has been outside since the weather has been much better. Patient noted bathroom irregularities yesterday and this morning though she feels better now.   Currently in Pain? Yes   Pain Score 4    Pain Location Knee   Pain Orientation Left   Pain Descriptors / Indicators Aching;Dull   Pain Type Surgical pain            [facility] Adult PT Treatment/Exercise - 01/08/14 1243    Knee/Hip Exercises: Stretches   Active Hamstring Stretch Limitations 3 way hamstring stretch to chair in door way 20xseconds each way.    Quad Stretch 20 seconds;4 reps   Hip Flexor Stretch 4 reps;20 seconds   Knee: Self-Stretch to increase Flexion 10  seconds   Knee: Self-Stretch Limitations heel slides in supine 10x 3 second hold   ITB Stretch 4 reps;20 seconds   Gastroc Stretch 4 reps;20 seconds   Knee/Hip Exercises: Standing   Other Standing Knee Exercises 3D hip excursions 10x   Other Standing Knee Exercises Split stance squats 5x each   Manual Therapy   Massage soft tissue mobilization of ITband, quadraceps, medial hamstring.          PT Education - 01/08/14 1244    Education provided Yes   Education Details patient was again educated on performance of stretces for HEP: quads, hamstrings, ITband, groind, calf and hipflexor stretches. Patient also advised to seek medical attention if she continued to have any more bathroom complaints as well as with any signs of knee infections (redness or change in color, heat, worsening heaviness or pain) Though no signs of infection were noted this session.    Methods Explanation;Demonstration;Tactile cues;Verbal cues  hand out previously given on stretches   Comprehension Verbalized understanding;Returned demonstration          PT Short Term Goals - 01/08/14 1252    PT SHORT TERM GOAL #1   Title Patient will display improved Lt knee extension to -5 degrees from full extension to normalize gait  PT SHORT TERM GOAL #2   Title Patient will displasy imrpoved ankle dorsiflexion to 10 degrees  bilaterally to decrease early heel rise durign gait    PT SHORT TERM GOAL #3   Title Patient will demosntrate increased knee flexion to >110 degrees to improve squat depth          PT Long Term Goals - 01/08/14 1252    PT LONG TERM GOAL #1   Title Patient will display increased knee extension to 0 degrees to normalize stride length   PT LONG TERM GOAL #2   Title Patient will dispalsy increased ankle dorsiflexion to >15 degrees to decrease early heel rise durign gait   PT LONG TERM GOAL #3   Title Patient will display increased knee flexion arom >120 degrees to tormanilze squat depth.    PT LONG  TERM GOAL #4   Title Patient will demonstrate increased knee flexion/extension strength of 5/5 MMT to be able to walk up and downstairs without UE support.          Plan - 01/08/14 1247    Clinical Impression Statement Patient with decreased pain since last session but noted fatigue this session secondary to diarhea and possible GI iinfection. No redness, abnormal warmth, or abnormal pain noted in Lt knee. wound is haling well. This session continued focus on education and performance of LE stretches to improve Lt knne AROM and gait. Patient verbalized understanding of importance of stretching and stated she will be performing them at home.    PT Plan Continue Primary focus of therapy initially on regaining full knee extension AROM with secondary focus on knee flexion to normalize gait. As ROM improves focus to shift to improving Lt LE strength and stability for gait.  Next session introduce walking 2D hip excursions.      Problem List Patient Active Problem List   Diagnosis Date Noted  . Primary osteoarthritis of left knee 01/01/2014  . Knee stiffness 01/01/2014  . Knee pain, left 01/01/2014  . Difficulty walking 01/01/2014  . S/P left TKA 12/08/2013     Jonh Mcqueary R 01/08/2014, 12:53 PM

## 2014-01-13 ENCOUNTER — Ambulatory Visit (HOSPITAL_COMMUNITY)
Admission: RE | Admit: 2014-01-13 | Discharge: 2014-01-13 | Disposition: A | Discharge status: Home / Self Care | Payer: No Typology Code available for payment source | Source: Ambulatory Visit | Attending: Family Medicine | Admitting: Family Medicine

## 2014-01-13 DIAGNOSIS — M1712 Unilateral primary osteoarthritis, left knee: Secondary | ICD-10-CM

## 2014-01-13 DIAGNOSIS — R29898 Other symptoms and signs involving the musculoskeletal system: Secondary | ICD-10-CM

## 2014-01-13 DIAGNOSIS — M25662 Stiffness of left knee, not elsewhere classified: Secondary | ICD-10-CM

## 2014-01-13 DIAGNOSIS — R262 Difficulty in walking, not elsewhere classified: Secondary | ICD-10-CM

## 2014-01-13 DIAGNOSIS — M25562 Pain in left knee: Secondary | ICD-10-CM

## 2014-01-13 DIAGNOSIS — Z471 Aftercare following joint replacement surgery: Secondary | ICD-10-CM | POA: Diagnosis not present

## 2014-01-13 NOTE — Therapy (Signed)
Physical Therapy Treatment  Patient Details  Name: Teresa Charles MRN: [id number] Date of Birth: [birth year]  Encounter Date: 01/13/2014      PT End of Session - 01/13/14 1604    Visit Number 5   Number of Visits 16   Date for PT Re-Evaluation 01/29/14   Authorization Type [employer]   Authorization Time Period 30 visit limit   Authorization - Visit Number 5   Authorization - Number of Visits 16   PT Start Time 1525   PT Stop Time 1604   PT Time Calculation (min) 39 min      Past Medical History  Diagnosis Date  . Dysrhythmia     palpitations  . Hypothyroidism   . Anxiety   . Depression   . Arthritis     Past Surgical History  Procedure Laterality Date  . Cardiac catheterization  2009  . Total knee arthroplasty Left 12/08/2013    Procedure: LEFT TOTAL KNEE ARTHROPLASTY;  Surgeon: Shelda PalMatthew D Olin, MD;  Location: [facility];  Service: Orthopedics;  Laterality: Left;    There were no vitals taken for this visit.  Visit Diagnosis:  Knee stiffness, left  Primary osteoarthritis of left knee  Knee pain, left  Difficulty walking  Weakness of left lower extremity      Subjective Assessment - 01/13/14 1527    Symptoms Pt reports mild increase in pain today after walking a lot yesterday with her cane; pt reports she walked ~1 and a 1/2 hours with a 15 minute seated break about halfway through.  Pt reprots she took a percocet ~30 minutes prior to PT visit.    Currently in Pain? Yes   Pain Score 3    Pain Location Knee   Pain Orientation Left   Pain Descriptors / Indicators Aching   Pain Type Surgical pain          [facility] PT Assessment - 01/13/14 1531    Assessment   Medical Diagnosis Lt knee stiffness, weakness and pain s/p Lt Knee replacement.    Next MD Visit Teresa NamStephen Charles and Doctor Teresa Charles 01/20/14   Prior Therapy Home health therapy: Teresa Charles was great.           [facility] Adult PT Treatment/Exercise - 01/13/14 1531    Exercises   Exercises Knee/Hip   Knee/Hip  Exercises: Stretches   Active Hamstring Stretch 3 reps;30 seconds   Active Hamstring Stretch Limitations 3 way on 14" Box   Quad Stretch 3 reps;30 seconds   Quad Stretch Limitations prone with rope   Knee: Self-Stretch to increase Flexion 10 seconds;5 reps   Knee: Self-Stretch Limitations heel slides with rope and on 14" Box   Gastroc Stretch 3 reps;30 seconds   Gastroc Stretch Limitations Slantboard   Knee/Hip Exercises: Standing   Heel Raises 2 sets;10 reps   Heel Raises Limitations Toe Raises 2x10   Lateral Step Up 10 reps;Hand Hold: 1;Step Height: 4";Left   Forward Step Up 10 reps;Hand Hold: 0;Step Height: 4";Left   Other Standing Knee Exercises Walking 2D hip    Other Standing Knee Exercises Split stance each way and neutral squats 5x each   Knee/Hip Exercises: Supine   Quad Sets 10 reps   Quad Sets Limitations 5" hold with heel prop   Straight Leg Raises 2 sets;10 reps;Left   Straight Leg Raises Limitations 1#   Knee/Hip Exercises: Prone   Hip Extension 2 sets;10 reps;Left   Hip Extension Limitations 1#  PT Education - 01/13/14 1603    Education provided Yes   Education Details Reviewed scar massage techniques in vertical pattern and across incision line.    Person(s) Educated Patient   Methods Explanation;Demonstration   Comprehension Verbalized understanding;Returned demonstration          PT Short Term Goals - 01/13/14 1545    PT SHORT TERM GOAL #1   Title Patient will display improved Lt knee extension to -5 degrees from full extension to normalize gait   Status On-going   PT SHORT TERM GOAL #2   Title Patient will displasy imrpoved ankle dorsiflexion to 10 degrees  bilaterally to decrease early heel rise durign gait    Status On-going   PT SHORT TERM GOAL #3   Title Patient will demosntrate increased knee flexion to >110 degrees to improve squat depth   Status On-going          PT Long Term Goals - 01/13/14 1545    PT LONG TERM GOAL #4    Title Patient will demonstrate increased knee flexion/extension strength of 5/5 MMT to be able to walk up and downstairs without UE support.   Status On-going          Plan - 01/13/14 1619    Clinical Impression Statement Pt reported some difficulty with treatment session today secondary to generalized pain in the Lt knee from increased walking yesterday; noted some antalgic gait pattern with ambulation in clinic along with decreased TKE and flat foot placement on the Lt secondary to decreased knee extension.  Progressed exercises today, working on increased knee flexion and extension to tolerance.  Re-educated pt on scar massage technique to the lt knee around and over incision line as wound is closed (advised pt no excessive pulling on Lt and Rt side to avoid opening incision line).   Added SLR in flexion and extension today, requiring rest breaks with flexion and VC to avoid extensor lag secondary to hip flexor/quadricep weakness on the lt LE.    Pt will benefit from skilled therapeutic intervention in order to improve on the following deficits Abnormal gait;Decreased activity tolerance;Decreased endurance;Decreased range of motion;Decreased mobility;Decreased scar mobility;Decreased strength;Difficulty walking   PT Treatment/Interventions Therapeutic exercise   PT Plan Progress ROM and strengthening exercises to tolerance, focusing on full ROM and technique (avoid extensor lag with good quad set).  updated HEP for mat strengthening program as tolerated.         Problem List Patient Active Problem List   Diagnosis Date Noted  . Primary osteoarthritis of left knee 01/01/2014  . Knee stiffness 01/01/2014  . Knee pain, left 01/01/2014  . Difficulty walking 01/01/2014  . S/P left TKA 12/08/2013     Teresa Charles 01/13/2014, 5:44 PM

## 2014-01-15 ENCOUNTER — Ambulatory Visit (HOSPITAL_COMMUNITY)
Admission: RE | Admit: 2014-01-15 | Discharge: 2014-01-15 | Disposition: A | Discharge status: Home / Self Care | Payer: No Typology Code available for payment source | Source: Ambulatory Visit | Attending: Family Medicine | Admitting: Family Medicine

## 2014-01-15 DIAGNOSIS — R262 Difficulty in walking, not elsewhere classified: Secondary | ICD-10-CM

## 2014-01-15 DIAGNOSIS — M1712 Unilateral primary osteoarthritis, left knee: Secondary | ICD-10-CM

## 2014-01-15 DIAGNOSIS — Z471 Aftercare following joint replacement surgery: Secondary | ICD-10-CM | POA: Diagnosis not present

## 2014-01-15 DIAGNOSIS — R29898 Other symptoms and signs involving the musculoskeletal system: Secondary | ICD-10-CM

## 2014-01-15 DIAGNOSIS — M25662 Stiffness of left knee, not elsewhere classified: Secondary | ICD-10-CM

## 2014-01-15 DIAGNOSIS — M25562 Pain in left knee: Secondary | ICD-10-CM

## 2014-01-15 NOTE — Therapy (Signed)
Physical Therapy Treatment  Patient Details  Name: Teresa Charles MRN: [id number] Date of Birth: 01-08-1961  Encounter Date: 01/15/2014      PT End of Session - 01/15/14 1555    Visit Number 6   Number of Visits 16   Date for PT Re-Evaluation 01/29/14   Authorization Type [employer]   Authorization Time Period 30 visit limit   Authorization - Visit Number 6   Authorization - Number of Visits 16   PT Start Time 1518   PT Stop Time 1558   PT Time Calculation (min) 40 min   Activity Tolerance Patient tolerated treatment well      Past Medical History  Diagnosis Date  . Dysrhythmia     palpitations  . Hypothyroidism   . Anxiety   . Depression   . Arthritis     Past Surgical History  Procedure Laterality Date  . Cardiac catheterization  2009  . Total knee arthroplasty Left 12/08/2013    Procedure: LEFT TOTAL KNEE ARTHROPLASTY;  Surgeon: Shelda PalMatthew D Olin, MD;  Location: [facility];  Service: Orthopedics;  Laterality: Left;    There were no vitals taken for this visit.  Visit Diagnosis:  Knee stiffness, left  Primary osteoarthritis of left knee  Knee pain, left  Difficulty walking  Weakness of left lower extremity      Subjective Assessment - 01/15/14 1520    Symptoms Pt reports increased complaints of pain in the Lt knee at 6/10 requiring her to use ice and take her pain medication.     Currently in Pain? Yes   Pain Score 3    Pain Location Knee   Pain Orientation Left          [facility] PT Assessment - 01/15/14 1523    Assessment   Medical Diagnosis Lt knee stiffness, weakness and pain s/p Lt Knee replacement.    Next MD Visit [name] and [name] 01/21/14          [facility] Adult PT Treatment/Exercise - 01/15/14 1522    Exercises   Exercises Knee/Hip   Knee/Hip Exercises: Stretches   Active Hamstring Stretch 3 reps;20 seconds   Active Hamstring Stretch Limitations 3 way on 14" Box   Quad Stretch 3 reps;30 seconds   Quad Stretch Limitations prone  with rope   Knee: Self-Stretch to increase Flexion 10 seconds;5 reps   Knee: Self-Stretch Limitations heel slides with rope and on 14" Box   Gastroc Stretch 3 reps;30 seconds   Gastroc Stretch Limitations Slantboard   Knee/Hip Exercises: Aerobic   Stationary Bike 5' Seat 7   Knee/Hip Exercises: Standing   Heel Raises 20 reps   Knee Flexion Left;2 sets;10 reps   Forward Lunges 10 reps;Left   Forward Lunges Limitations onto 4" box   Lateral Step Up 15 reps;Hand Hold: 1;Step Height: 4";Left   Forward Step Up 15 reps;Hand Hold: 0;Step Height: 4";Left   Knee/Hip Exercises: Supine   Quad Sets 10 reps   Quad Sets Limitations 5" hold with heel prop   Straight Leg Raises 10 reps;Left   Straight Leg Raises Limitations 2#   Knee/Hip Exercises: Sidelying   Hip ABduction 10 reps;Left   Hip ABduction Limitations 2#   Knee/Hip Exercises: Prone   Hip Extension 10 reps;Left   Hip Extension Limitations 2#            PT Short Term Goals - 01/15/14 1558    PT SHORT TERM GOAL #1   Title Patient will display improved  Lt knee extension to -5 degrees from full extension to normalize gait   Status On-going   PT SHORT TERM GOAL #2   Title Patient will displasy imrpoved ankle dorsiflexion to 10 degrees bilaterally to decrease early heel rise during gait    Status On-going   PT SHORT TERM GOAL #3   Title Patient will demosntrate increased knee flexion to >110 degrees to improve squat depth   Status On-going          PT Long Term Goals - 01/15/14 1558    PT LONG TERM GOAL #2   Title Patient will dispalsy increased ankle dorsiflexion to >15 degrees to decrease early heel rise durign gait   Status On-going          Plan - 01/15/14 1556    Clinical Impression Statement Continued therapuetic exercise program, increasing reps as able.  Continued focus on improving ROM of the Lt knee, with addition of stationary bike today with full revolutions after ~1 minute of warm up on rocking.   Added  lunges, with VC to decrease use of UE, to increase strength of Lt LE and knee flexion.  Mild increase in pain to 3-4/10 by end of treatment session.    Pt will benefit from skilled therapeutic intervention in order to improve on the following deficits Abnormal gait;Decreased activity tolerance;Decreased endurance;Decreased range of motion;Decreased mobility;Decreased scar mobility;Decreased strength;Difficulty walking   PT Treatment/Interventions Therapeutic exercise   PT Plan MD Note next visit.         Problem List Patient Active Problem List   Diagnosis Date Noted  . Primary osteoarthritis of left knee 01/01/2014  . Knee stiffness 01/01/2014  . Knee pain, left 01/01/2014  . Difficulty walking 01/01/2014  . S/P left TKA 12/08/2013    Kellie ShropshireStephanie Kenyada Dosch, DPT [phone]

## 2014-01-19 ENCOUNTER — Ambulatory Visit (HOSPITAL_COMMUNITY)
Admission: RE | Admit: 2014-01-19 | Discharge: 2014-01-19 | Disposition: A | Discharge status: Home / Self Care | Payer: No Typology Code available for payment source | Source: Ambulatory Visit | Attending: Family Medicine | Admitting: Family Medicine

## 2014-01-19 DIAGNOSIS — M25662 Stiffness of left knee, not elsewhere classified: Secondary | ICD-10-CM

## 2014-01-19 DIAGNOSIS — R29898 Other symptoms and signs involving the musculoskeletal system: Secondary | ICD-10-CM

## 2014-01-19 DIAGNOSIS — M25562 Pain in left knee: Secondary | ICD-10-CM

## 2014-01-19 DIAGNOSIS — R262 Difficulty in walking, not elsewhere classified: Secondary | ICD-10-CM

## 2014-01-19 DIAGNOSIS — Z471 Aftercare following joint replacement surgery: Secondary | ICD-10-CM | POA: Diagnosis not present

## 2014-01-19 DIAGNOSIS — M1712 Unilateral primary osteoarthritis, left knee: Secondary | ICD-10-CM

## 2014-01-19 NOTE — Therapy (Signed)
Physical Therapy Reassessment  Patient Details  Name: Teresa Charles MRN: [id number] Date of Birth: 1961-01-31  Encounter Date: 01/19/2014      PT End of Session - 01/19/14 1538    Visit Number 7   Number of Visits 16   Date for PT Re-Evaluation 01/29/14   Authorization Type [employer]   Authorization Time Period 30 visit limit   Authorization - Visit Number 7   Authorization - Number of Visits 16   PT Start Time 3300   PT Stop Time 1600   PT Time Calculation (min) 41 min   Activity Tolerance Patient tolerated treatment well      Past Medical History  Diagnosis Date  . Dysrhythmia     palpitations  . Hypothyroidism   . Anxiety   . Depression   . Arthritis     Past Surgical History  Procedure Laterality Date  . Cardiac catheterization  2009  . Total knee arthroplasty Left 12/08/2013    Procedure: LEFT TOTAL KNEE ARTHROPLASTY;  Surgeon: Mauri Pole, MD;  Location: [facility];  Service: Orthopedics;  Laterality: Left;    There were no vitals taken for this visit.  Visit Diagnosis:  Knee stiffness, left  Primary osteoarthritis of left knee  Knee pain, left  Difficulty walking  Weakness of left lower extremity      Subjective Assessment - 01/19/14 1534    Symptoms Patient reports everyday she still has bad pain resultign in "walking like I'm crippled," patient noted that her right knee bothered her saturday, but no pain since. Patient feels liek her knee is getting better, but will continue to occasionally have severe pain, especially if performing too much activity. Patient reports she is now sleepign 4 straight hours at night (was only 2)          [facility] PT Assessment - 01/19/14 0001    Assessment   Medical Diagnosis Lt knee stiffness, weakness and pain s/p Lt Knee replacement.    Onset Date 12/08/13   Next MD Visit Gerrit Halls and Doctor Alvan Dame 01/21/14   Prior Therapy Home health therapy: Suezanne Jacquet was great.    AROM   Left Hip Flexion 135   Left Hip External  Rotation  40   Left Hip Internal Rotation  40   Left Knee Extension -5   Left Knee Flexion 115   Right Ankle Dorsiflexion 8   Left Ankle Dorsiflexion 8   Strength   Right Hip Flexion 5/5   Left Hip Flexion 4/5   Right Knee Flexion 5/5   Right Knee Extension 5/5   Left Knee Flexion 5/5  4+/5   Left Knee Extension 5/5   Right Ankle Dorsiflexion 5/5   Left Ankle Dorsiflexion 5/5   Flexibility   Hamstrings limited   Quadriceps limited   Thomas Test    Findings Negative   Side Left;Right   Ober's Test   Findings Negative   Side Left;Right   Piriformis Test   Findings Negative   Side  Left;Right   other   Findings Positive   Side Left   Comments Ely's test          [facility] Adult PT Treatment/Exercise - 01/19/14 0001    Knee/Hip Exercises: Stretches   Active Hamstring Stretch 3 reps;20 seconds   Active Hamstring Stretch Limitations 3 way on 14" Box   Quad Stretch 5 reps;20 seconds   Quad Stretch Limitations prone with rope   Knee: Self-Stretch to increase Flexion 10 seconds;5 reps  Knee: Self-Stretch Limitations heel slides with rope and on 14" Box   Gastroc Stretch 3 reps;30 seconds   Gastroc Stretch Limitations Slantboard   Knee/Hip Exercises: Standing   Other Standing Knee Exercises Retro hamstring walk 39f.   Knee/Hip Exercises: Supine   Quad Sets 20 reps   Quad Sets Limitations 3" hold           PT Short Term Goals - 01/19/14 1601    PT SHORT TERM GOAL #1   Title Patient will display improved Lt knee extension to -5 degrees from full extension to normalize gait   Baseline Knee extension -10 degrees, now -5   Time 4   Period Weeks   Status Achieved   PT SHORT TERM GOAL #2   Title Patient will displasy imrpoved ankle dorsiflexion to 10 degrees bilaterally to decrease early heel rise during gait    Baseline Bilateral ankle dorsiflexion <5 degrees, now 8 degrees   Time 4   Period Weeks   Status On-going   PT SHORT TERM GOAL #3   Title Patient will  demosntrate increased knee flexion to >110 degrees to improve squat depth   Status Achieved          PT Long Term Goals - 01/19/14 1547    PT LONG TERM GOAL #1   Title Patient will display increased knee extension to 0 degrees to normalize stride length   Status On-going   PT LONG TERM GOAL #2   Title Patient will dispalsy increased ankle dorsiflexion to >15 degrees to decrease early heel rise durign gait   Status On-going   PT LONG TERM GOAL #3   Title Patient will display increased knee flexion arom >120 degrees to tormanilze squat depth.    Status On-going   PT LONG TERM GOAL #4   Title Patient will demonstrate increased knee flexion/extension strength of 5/5 MMT to be able to walk up and downstairs without UE support.   Status Partially Met          Plan - 01/19/14 1539    Clinical Impression Statement Patient is makign good steady progress towards goals , though she continues to dispaly limited ankle dorsiflexion and limited knee extension attributed to end range pain. Patient is expected to reach remianing ROM goals in 4 weeks as week as remaining  strength goals, though patient displays high pain sensitivity resultign in possible limited progress.    Pt will benefit from skilled therapeutic intervention in order to improve on the following deficits Abnormal gait;Decreased activity tolerance;Decreased endurance;Decreased range of motion;Decreased mobility;Decreased scar mobility;Decreased strength;Difficulty walking   Rehab Potential Good   PT Frequency 2x / week   PT Duration 4 weeks   PT Next Visit Plan Focus on increasign knee extension, secondary focus on increasign knee flexion.         Problem List Patient Active Problem List   Diagnosis Date Noted  . Primary osteoarthritis of left knee 01/01/2014  . Knee stiffness 01/01/2014  . Knee pain, left 01/01/2014  . Difficulty walking 01/01/2014  . S/P left TKA 12/08/2013   CDevona KonigPT DPT [phone]

## 2014-01-21 ENCOUNTER — Encounter (HOSPITAL_COMMUNITY): Payer: PRIVATE HEALTH INSURANCE

## 2014-01-27 ENCOUNTER — Ambulatory Visit (HOSPITAL_COMMUNITY)
Admission: RE | Admit: 2014-01-27 | Discharge: 2014-01-27 | Disposition: A | Discharge status: Home / Self Care | Payer: No Typology Code available for payment source | Source: Ambulatory Visit | Attending: Family Medicine | Admitting: Family Medicine

## 2014-01-27 DIAGNOSIS — M25662 Stiffness of left knee, not elsewhere classified: Secondary | ICD-10-CM

## 2014-01-27 DIAGNOSIS — M25562 Pain in left knee: Secondary | ICD-10-CM | POA: Diagnosis not present

## 2014-01-27 DIAGNOSIS — R262 Difficulty in walking, not elsewhere classified: Secondary | ICD-10-CM

## 2014-01-27 DIAGNOSIS — Z471 Aftercare following joint replacement surgery: Secondary | ICD-10-CM | POA: Insufficient documentation

## 2014-01-27 DIAGNOSIS — M6281 Muscle weakness (generalized): Secondary | ICD-10-CM | POA: Diagnosis not present

## 2014-01-27 DIAGNOSIS — Z96652 Presence of left artificial knee joint: Secondary | ICD-10-CM | POA: Insufficient documentation

## 2014-01-27 DIAGNOSIS — R29898 Other symptoms and signs involving the musculoskeletal system: Secondary | ICD-10-CM

## 2014-01-27 DIAGNOSIS — M1712 Unilateral primary osteoarthritis, left knee: Secondary | ICD-10-CM

## 2014-01-27 NOTE — Therapy (Signed)
Physical Therapy Treatment  Patient Details  Name: Teresa Charles MRN: [id number] Date of Birth: 11-17-1960  Encounter Date: 01/27/2014      PT End of Session - 01/27/14 0950    Visit Number 8   Number of Visits 16   Date for PT Re-Evaluation 02/23/14   Authorization Type [employer]   Authorization Time Period 30 visit limit   Authorization - Visit Number 8   Authorization - Number of Visits 16   PT Start Time 0930   PT Stop Time 1015   PT Time Calculation (min) 45 min   Activity Tolerance Patient tolerated treatment well   Behavior During Therapy [facility] for tasks assessed/performed      Past Medical History  Diagnosis Date  . Dysrhythmia     palpitations  . Hypothyroidism   . Anxiety   . Depression   . Arthritis     Past Surgical History  Procedure Laterality Date  . Cardiac catheterization  2009  . Total knee arthroplasty Left 12/08/2013    Procedure: LEFT TOTAL KNEE ARTHROPLASTY;  Surgeon: Mauri Pole, MD;  Location: [facility];  Service: Orthopedics;  Laterality: Left;    There were no vitals taken for this visit.  Visit Diagnosis:  Knee stiffness, left  Primary osteoarthritis of left knee  Knee pain, left  Difficulty walking  Weakness of left lower extremity      Subjective Assessment - 01/27/14 0933    Symptoms Following recent MD appointment patient noted that MD told her pain, swelling are normal at this poitn and that she is doing well. Patient reports hurting her right lateral thigh while sleeping  last night.    Currently in Pain? Yes   Pain Score 3    Pain Location Knee   Pain Orientation Left   Pain Descriptors / Indicators Aching          [facility] Adult PT Treatment/Exercise - 01/27/14 0001    Knee/Hip Exercises: Stretches   Active Hamstring Stretch Limitations 10x 3 seconds 3 way, 3D hamstring drives to 12"    Quad Stretch 5 reps;20 seconds   Quad Stretch Limitations prone with rope   Knee: Self-Stretch Limitations 3way knee drives on 12"  box   ITB Stretch 4 reps;20 seconds   Gastroc Stretch 3 reps;30 seconds   Gastroc Stretch Limitations Slantboard   Knee/Hip Exercises: Aerobic   Stationary Bike 5' Seat 7   Knee/Hip Exercises: Standing   Heel Raises 20 reps   Forward Step Up 15 reps;Hand Hold: 0;Left;Step Height: 6"   Functional Squat Limitations 10x 3 sets: split stance and neutral   Gait Training Walking 45f: high knees, heel ups, lunge walks    Other Standing Knee Exercises Split stance 3D hip excursions 10x          PT Short Term Goals - 01/27/14 0955    PT SHORT TERM GOAL #1   Title Patient will display improved Lt knee extension to -5 degrees from full extension to normalize gait   Baseline Knee extension -10 degrees, now -5   Time 4   Period Weeks   Status Achieved   PT SHORT TERM GOAL #2   Title Patient will displasy imrpoved ankle dorsiflexion to 10 degrees bilaterally to decrease early heel rise during gait    Baseline Bilateral ankle dorsiflexion <5 degrees, now 8 degrees   Time 4   Period Weeks   Status On-going   PT SHORT TERM GOAL #3   Title Patient will demosntrate  increased knee flexion to >110 degrees to improve squat depth   Status Achieved          PT Long Term Goals - 01/27/14 1008    PT LONG TERM GOAL #1   Title Patient will display increased knee extension to 0 degrees to normalize stride length   Status On-going   PT LONG TERM GOAL #2   Title Patient will dispalsy increased ankle dorsiflexion to >15 degrees to decrease early heel rise durign gait   Status On-going   PT LONG TERM GOAL #3   Title Patient will display increased knee flexion arom >120 degrees to tormanilze squat depth.    Status On-going   PT LONG TERM GOAL #4   Title Patient will demonstrate increased knee flexion/extension strength of 5/5 MMT to be able to walk up and downstairs without UE support.   Status Partially Met   PT LONG TERM GOAL #5   Title Patient will be able to squat and pivot while lifting 50lb  to be able to return to work.    Baseline currently unable due to balance and weakness   Time 4   Period Weeks   Status New          Plan - 01/27/14 1011    Clinical Impression Statement Patient's exercises reviewed and progressed to improve knee flexion/extension and progress LE strength as patient must be able to lift 50lb and pivot to return to work. Patient dmeosntrated good gait mechanics this session, but limited control of Lt knee during swing phase of gait attributed to stifness secondary to swelling.    PT Next Visit Plan Focus on increasing knee extension, secondary focus on increasign knee flexion as well as progressign functional strengthening.         Problem List Patient Active Problem List   Diagnosis Date Noted  . Primary osteoarthritis of left knee 01/01/2014  . Knee stiffness 01/01/2014  . Knee pain, left 01/01/2014  . Difficulty walking 01/01/2014  . S/P left TKA 12/08/2013   Devona Konig PT DPT [phone]

## 2014-01-29 ENCOUNTER — Ambulatory Visit (HOSPITAL_COMMUNITY)
Admission: RE | Admit: 2014-01-29 | Discharge: 2014-01-29 | Disposition: A | Discharge status: Home / Self Care | Payer: No Typology Code available for payment source | Source: Ambulatory Visit | Attending: Family Medicine | Admitting: Family Medicine

## 2014-01-29 DIAGNOSIS — M25662 Stiffness of left knee, not elsewhere classified: Secondary | ICD-10-CM

## 2014-01-29 DIAGNOSIS — Z471 Aftercare following joint replacement surgery: Secondary | ICD-10-CM | POA: Diagnosis not present

## 2014-01-29 DIAGNOSIS — M25562 Pain in left knee: Secondary | ICD-10-CM

## 2014-01-29 DIAGNOSIS — M1712 Unilateral primary osteoarthritis, left knee: Secondary | ICD-10-CM

## 2014-01-29 DIAGNOSIS — R29898 Other symptoms and signs involving the musculoskeletal system: Secondary | ICD-10-CM

## 2014-01-29 DIAGNOSIS — R262 Difficulty in walking, not elsewhere classified: Secondary | ICD-10-CM

## 2014-01-29 NOTE — Therapy (Signed)
[facility] [address] [place], [place], [address] Phone: [phone]   Fax:  [phone]  Physical Therapy Treatment  Patient Details  Name: GENTRY SEEBER MRN: [id number] Date of Birth: 10-31-1960  Encounter Date: 01/29/2014      PT End of Session - 01/29/14 1101    Visit Number 9   Number of Visits 16   Date for PT Re-Evaluation 02/23/14   Authorization Type [employer]   Authorization Time Period 30 visit limit   Authorization - Visit Number 9   Authorization - Number of Visits 10   PT Start Time 1018   PT Stop Time 1100   PT Time Calculation (min) 42 min   Activity Tolerance Patient tolerated treatment well   Behavior During Therapy [facility] for tasks assessed/performed      Past Medical History  Diagnosis Date  . Dysrhythmia     palpitations  . Hypothyroidism   . Anxiety   . Depression   . Arthritis     Past Surgical History  Procedure Laterality Date  . Cardiac catheterization  2009  . Total knee arthroplasty Left 12/08/2013    Procedure: LEFT TOTAL KNEE ARTHROPLASTY;  Surgeon: Mauri Pole, MD;  Location: [facility];  Service: Orthopedics;  Laterality: Left;    There were no vitals taken for this visit.  Visit Diagnosis:  Knee stiffness, left  Primary osteoarthritis of left knee  Knee pain, left  Difficulty walking  Weakness of left lower extremity      Subjective Assessment - 01/29/14 1019    Symptoms Pt reported she did  a lot of walking yesterday, reports last night was the best night she has had in a long time with medication assistance.  Reports she is trying to reduce pain medication   Currently in Pain? Yes   Pain Score 5    Pain Location Knee   Pain Orientation Left;Anterior            [facility] Adult PT Treatment/Exercise - 01/29/14 0001    Exercises   Exercises Knee/Hip   Knee/Hip Exercises: Stretches   Active Hamstring Stretch 3 reps;30 seconds   Active Hamstring Stretch Limitations manual muscle energy  techniques   Quad Stretch 3 reps;30 seconds   Quad Stretch Limitations prone with rope   Gastroc Stretch 3 reps;30 seconds   Gastroc Stretch Limitations Slantboard   Knee/Hip Exercises: Aerobic   Stationary Bike 8' Seat 7   Knee/Hip Exercises: Standing   Terminal Knee Extension Left;10 reps;Theraband   Theraband Level (Terminal Knee Extension) Level 3 (Green)   Gait Training Walking 226 with cueing for equal stance phase and stride length   Other Standing Knee Exercises Split stance 3D hip excursions 10x   Knee/Hip Exercises: Supine   Short Arc Quad Sets Left;15 reps   Terminal Knee Extension Left;15 reps   Knee/Hip Exercises: Prone   Other Prone Exercises TKE 10x 5"            PT Short Term Goals - 01/29/14 1244    PT SHORT TERM GOAL #1   Title Patient will display improved Lt knee extension to -5 degrees from full extension to normalize gait   Status Achieved   PT SHORT TERM GOAL #2   Title Patient will displasy imrpoved ankle dorsiflexion to 10 degrees bilaterally to decrease early heel rise during gait    Status On-going   PT SHORT TERM GOAL #3   Title Patient will demosntrate increased knee flexion to >110 degrees  to improve squat depth   Status Achieved          PT Long Term Goals - 01/29/14 1245    PT LONG TERM GOAL #1   Title Patient will display increased knee extension to 0 degrees to normalize stride length   Status On-going   PT LONG TERM GOAL #2   Title Patient will dispalsy increased ankle dorsiflexion to >15 degrees to decrease early heel rise durign gait   Status On-going   PT LONG TERM GOAL #3   Title Patient will display increased knee flexion arom >120 degrees to tormanilze squat depth.    Baseline -5-120 01/29/2014   Status Partially Met   PT LONG TERM GOAL #4   Title Patient will demonstrate increased knee flexion/extension strength of 5/5 MMT to be able to walk up and downstairs without UE support.   PT LONG TERM GOAL #5   Title Patient will  be able to squat and pivot while lifting 50lb to be able to return to work.           Plan - 01/29/14 1102    Clinical Impression Statement Session focus on improving AROM, continued with stretches to improve flexiibility, excursion exercises to improve hip mobiility, added manual muscle energy techniques to hamstrings and quadriceps for improved knee extension.  AROM today -6-120 degrees.  Verbal cueing required for equalized stance phase with gait   PT Next Visit Plan Focus on increasing knee extension, secondary focus on increasign knee flexion as well as progressign functional strengthening. Begin rockerboard next session to improve equalized stance phase with gait.       Problem List Patient Active Problem List   Diagnosis Date Noted  . Primary osteoarthritis of left knee 01/01/2014  . Knee stiffness 01/01/2014  . Knee pain, left 01/01/2014  . Difficulty walking 01/01/2014  . S/P left TKA 12/08/2013   Ihor Austin, Shirley  Aldona Lento 01/29/2014, 12:47 PM

## 2014-02-03 ENCOUNTER — Ambulatory Visit (HOSPITAL_COMMUNITY)
Admission: RE | Admit: 2014-02-03 | Discharge: 2014-02-03 | Disposition: A | Discharge status: Home / Self Care | Payer: No Typology Code available for payment source | Source: Ambulatory Visit | Attending: Family Medicine | Admitting: Family Medicine

## 2014-02-03 DIAGNOSIS — M25562 Pain in left knee: Secondary | ICD-10-CM

## 2014-02-03 DIAGNOSIS — R29898 Other symptoms and signs involving the musculoskeletal system: Secondary | ICD-10-CM

## 2014-02-03 DIAGNOSIS — Z471 Aftercare following joint replacement surgery: Secondary | ICD-10-CM | POA: Diagnosis not present

## 2014-02-03 DIAGNOSIS — M25662 Stiffness of left knee, not elsewhere classified: Secondary | ICD-10-CM

## 2014-02-03 DIAGNOSIS — M1712 Unilateral primary osteoarthritis, left knee: Secondary | ICD-10-CM

## 2014-02-03 DIAGNOSIS — R262 Difficulty in walking, not elsewhere classified: Secondary | ICD-10-CM

## 2014-02-03 NOTE — Therapy (Signed)
[facility] [address] [place], [place], [address] Phone: [phone]   Fax:  [phone]  Physical Therapy Treatment  Patient Details  Name: Teresa Charles MRN: [id number] Date of Birth: [birth year]  Encounter Date: 02/03/2014      PT End of Session - 02/03/14 1027    Visit Number 10   Number of Visits 16   Date for PT Re-Evaluation 02/23/14   Authorization Type [employer]   Authorization Time Period 30 visit limit   Authorization - Visit Number 10   Authorization - Number of Visits 16   PT Start Time 9735   PT Stop Time 1100   PT Time Calculation (min) 42 min   Activity Tolerance Patient tolerated treatment well   Behavior During Therapy [facility] for tasks assessed/performed      Past Medical History  Diagnosis Date  . Dysrhythmia     palpitations  . Hypothyroidism   . Anxiety   . Depression   . Arthritis     Past Surgical History  Procedure Laterality Date  . Cardiac catheterization  2009  . Total knee arthroplasty Left 12/08/2013    Procedure: LEFT TOTAL KNEE ARTHROPLASTY;  Surgeon: Mauri Pole, MD;  Location: [facility];  Service: Orthopedics;  Laterality: Left;    There were no vitals taken for this visit.  Visit Diagnosis:  Knee stiffness, left  Primary osteoarthritis of left knee  Knee pain, left  Difficulty walking  Weakness of left lower extremity      Subjective Assessment - 02/03/14 1029    Symptoms "I feel marvelous" I am no longer taking pain medication.  Patient notes stiffness and soreness in Rt hip. reports minmal adharance to HEP but does state walkign and cycling a lot independently.    Pertinent History 12/08/13 patient had Lt knee replacement. Patient works at [employer] as a Chartered certified accountant. Patient has noted difficulty sleeping and has not slept greater than 3 hours. Patient has to be bale to perfom a lot of liftign as well.    Currently in Pain? Yes   Pain Score 3    Pain Location Knee   Pain  Orientation Left;Anterior            [facility] Adult PT Treatment/Exercise - 02/03/14 0001    Knee/Hip Exercises: Stretches   Active Hamstring Stretch 3 reps;20 seconds   Active Hamstring Stretch Limitations 3 way on 14" Box   Quad Stretch 3 reps;30 seconds   Quad Stretch Limitations prone with rope   Hip Flexor Stretch Limitations 10x 3 seconds   Piriformis Stretch 3 reps;20 seconds   Piriformis Stretch Limitations seated   Gastroc Stretch 3 reps;30 seconds   Gastroc Stretch Limitations Slantboard   Knee/Hip Exercises: Standing   Heel Raises 20 reps   Terminal Knee Extension Left;Theraband;20 reps   Theraband Level (Terminal Knee Extension) Level 3 (Green)   Forward Step Up Step Height: 6";10 reps;2 sets   Forward Step Up Limitations second set with heel raise   Step Down 10 reps;Step Height: 2";Hand Hold: 1   Functional Squat Limitations Squat reach matrix 5x no resitance added.    Lunge Walking - Round Trips 2 with forward trunk lean   Other Standing Knee Exercises Retro hamstring walk 28f.          PT Education - 02/03/14 1038    Education provided No          PT Short Term Goals - 02/03/14 1100  PT SHORT TERM GOAL #1   Title Patient will display improved Lt knee extension to -5 degrees from full extension to normalize gait   Status Achieved   PT SHORT TERM GOAL #2   Title Patient will displasy imrpoved ankle dorsiflexion to 10 degrees bilaterally to decrease early heel rise during gait    Status On-going   PT SHORT TERM GOAL #3   Title Patient will demosntrate increased knee flexion to >110 degrees to improve squat depth   Status Achieved          PT Long Term Goals - 02/03/14 1101    PT LONG TERM GOAL #1   Title Patient will display increased knee extension to 0 degrees to normalize stride length   Status On-going   PT LONG TERM GOAL #2   Title Patient will dispalsy increased ankle dorsiflexion to >15 degrees to decrease early heel rise durign gait    Status On-going   PT LONG TERM GOAL #3   Title Patient will display increased knee flexion arom >120 degrees to tormanilze squat depth.    Baseline -5-120 01/29/2014   Status Partially Met   PT LONG TERM GOAL #4   Title Patient will demonstrate increased knee flexion/extension strength of 5/5 MMT to be able to walk up and downstairs without UE support.   PT LONG TERM GOAL #5   Title Patient will be able to squat and pivot while lifting 50lb to be able to return to work.           Plan - 02/03/14 1059    Clinical Impression Statement Session continued focus on improving AROM, continued with stretches to improve flexiibility, continued manual muscle energy techniques to hamstrings and quadriceps for improved knee extension. . walkign lunges performed to equalize stride length durign gait. Squat reach matrix performed through limited depth.    PT Next Visit Plan Focus on increasing knee extension, secondary focus on increasign knee flexion as well as progressing functional strengthening.        Problem List Patient Active Problem List   Diagnosis Date Noted  . Primary osteoarthritis of left knee 01/01/2014  . Knee stiffness 01/01/2014  . Knee pain, left 01/01/2014  . Difficulty walking 01/01/2014  . S/P left TKA 12/08/2013   Devona Konig PT DPT [phone]

## 2014-02-04 NOTE — Addendum Note (Signed)
Encounter addended by: Fredricka Bonineasey J Cockerham, PTA on: 02/04/2014  5:53 PM<BR>     Documentation filed: Charges VN

## 2014-02-05 ENCOUNTER — Ambulatory Visit (HOSPITAL_COMMUNITY)
Admission: RE | Admit: 2014-02-05 | Discharge: 2014-02-05 | Disposition: A | Discharge status: Home / Self Care | Payer: No Typology Code available for payment source | Source: Ambulatory Visit | Attending: Family Medicine | Admitting: Family Medicine

## 2014-02-05 DIAGNOSIS — M25662 Stiffness of left knee, not elsewhere classified: Secondary | ICD-10-CM

## 2014-02-05 DIAGNOSIS — R29898 Other symptoms and signs involving the musculoskeletal system: Secondary | ICD-10-CM

## 2014-02-05 DIAGNOSIS — M25562 Pain in left knee: Secondary | ICD-10-CM

## 2014-02-05 DIAGNOSIS — Z471 Aftercare following joint replacement surgery: Secondary | ICD-10-CM | POA: Diagnosis not present

## 2014-02-05 DIAGNOSIS — R262 Difficulty in walking, not elsewhere classified: Secondary | ICD-10-CM

## 2014-02-05 DIAGNOSIS — M1712 Unilateral primary osteoarthritis, left knee: Secondary | ICD-10-CM

## 2014-02-05 NOTE — Therapy (Signed)
[facility] [address] [place], [place], [address] Phone: [phone]   Fax:  [phone]  Physical Therapy Treatment  Patient Details  Name: Teresa Charles MRN: [id number] Date of Birth: 13-Jun-1960  Encounter Date: 02/05/2014      PT End of Session - 02/05/14 1100    Visit Number 11   Number of Visits 16   Date for PT Re-Evaluation 02/23/14   Authorization Type [employer]   Authorization Time Period 30 visit limit   Authorization - Visit Number 11   Authorization - Number of Visits 16   PT Start Time 9169   PT Stop Time 1107   PT Time Calculation (min) 52 min   Activity Tolerance Patient tolerated treatment well   Behavior During Therapy [facility] for tasks assessed/performed      Past Medical History  Diagnosis Date  . Dysrhythmia     palpitations  . Hypothyroidism   . Anxiety   . Depression   . Arthritis     Past Surgical History  Procedure Laterality Date  . Cardiac catheterization  2009  . Total knee arthroplasty Left 12/08/2013    Procedure: LEFT TOTAL KNEE ARTHROPLASTY;  Surgeon: Mauri Pole, MD;  Location: [facility];  Service: Orthopedics;  Laterality: Left;    There were no vitals taken for this visit.  Visit Diagnosis:  Knee stiffness, left  Primary osteoarthritis of left knee  Knee pain, left  Difficulty walking  Weakness of left lower extremity      Subjective Assessment - 02/05/14 1019    Symptoms Patient states feeling good. Patient reports keeping up with her exercises at home.    Currently in Pain? Yes   Pain Score 2    Pain Location Knee   Pain Orientation Left;Anterior   Pain Descriptors / Indicators Aching            [facility] Adult PT Treatment/Exercise - 02/05/14 0001    Knee/Hip Exercises: Stretches   Active Hamstring Stretch 2 reps;20 seconds   Active Hamstring Stretch Limitations 3way to 8" box   Quad Stretch 30 seconds;4 reps   Quad Stretch Limitations  and overpressure from therapist.    Hip  Flexor Stretch Limitations 10x 3 seconds with therapist assist.    Piriformis Stretch 4 reps;20 seconds   Piriformis Stretch Limitations seated   Gastroc Stretch 3 reps;30 seconds   Gastroc Stretch Limitations Slantboard   Knee/Hip Exercises: Aerobic   Stationary Bike 8' seat 6   Knee/Hip Exercises: Standing   Heel Raises 20 reps   Forward Lunges 10 reps;Left   Forward Lunges Limitations onto 4" box   Terminal Knee Extension Left;Theraband;20 reps   Theraband Level (Terminal Knee Extension) Level 4 (Blue)   Forward Step Up 10 reps;2 sets;Step Height: 8"   Forward Step Up Limitations second set with heel raise   Step Down 10 reps;Step Height: 2";Hand Hold: 1   Functional Squat Limitations Squat reach matrix 5x no resitance added.    Other Standing Knee Exercises Sumo walk with green Tband            PT Short Term Goals - 02/05/14 1101    PT SHORT TERM GOAL #1   Title Patient will display improved Lt knee extension to -5 degrees from full extension to normalize gait   Status Achieved   PT SHORT TERM GOAL #2   Title Patient will displasy imrpoved ankle dorsiflexion to 10 degrees bilaterally to decrease early heel rise during gait  Status On-going   PT SHORT TERM GOAL #3   Title Patient will demosntrate increased knee flexion to >110 degrees to improve squat depth   Status Achieved          PT Long Term Goals - 02/05/14 1101    PT LONG TERM GOAL #1   Title Patient will display increased knee extension to 0 degrees to normalize stride length   Status On-going   PT LONG TERM GOAL #2   Title Patient will dispalsy increased ankle dorsiflexion to >15 degrees to decrease early heel rise durign gait   Status On-going   PT LONG TERM GOAL #3   Title Patient will display increased knee flexion arom >120 degrees to tormanilze squat depth.    Baseline -5-120 01/29/2014   Status Partially Met   PT LONG TERM GOAL #4   Title Patient will demonstrate increased knee flexion/extension  strength of 5/5 MMT to be able to walk up and downstairs without UE support.   PT LONG TERM GOAL #5   Title Patient will be able to squat and pivot while lifting 50lb to be able to return to work.           Plan - 02/05/14 1047    Clinical Impression Statement Session continued focus on improving AROM, continued with stretches to improve flexiibility, continued manual muscle energy techniques to hamstrings and quadriceps for improved knee extension. Lunges performed to 4" box to equalize stride length durign gait. Squat reach matrix performed through limited depth . Patient dispalsyed difficulty with 2" box step down due to limited terminal kneeextension strength, though patient is able to achieve full extension with therapist assist.    PT Next Visit Plan Focus on increasing knee extension, secondary focus on increasign knee flexion as well as progressing functional strengthening. Add chair for cuing durign squat reach matrix         Problem List Patient Active Problem List   Diagnosis Date Noted  . Primary osteoarthritis of left knee 01/01/2014  . Knee stiffness 01/01/2014  . Knee pain, left 01/01/2014  . Difficulty walking 01/01/2014  . S/P left TKA 12/08/2013    Devona Konig PT DPT [phone]

## 2014-02-10 ENCOUNTER — Ambulatory Visit (HOSPITAL_COMMUNITY)
Admission: RE | Admit: 2014-02-10 | Discharge: 2014-02-10 | Disposition: A | Discharge status: Home / Self Care | Payer: No Typology Code available for payment source | Source: Ambulatory Visit | Attending: Family Medicine | Admitting: Family Medicine

## 2014-02-10 DIAGNOSIS — R29898 Other symptoms and signs involving the musculoskeletal system: Secondary | ICD-10-CM

## 2014-02-10 DIAGNOSIS — Z471 Aftercare following joint replacement surgery: Secondary | ICD-10-CM | POA: Diagnosis not present

## 2014-02-10 DIAGNOSIS — M1712 Unilateral primary osteoarthritis, left knee: Secondary | ICD-10-CM

## 2014-02-10 DIAGNOSIS — M25562 Pain in left knee: Secondary | ICD-10-CM

## 2014-02-10 DIAGNOSIS — R262 Difficulty in walking, not elsewhere classified: Secondary | ICD-10-CM

## 2014-02-10 DIAGNOSIS — M25662 Stiffness of left knee, not elsewhere classified: Secondary | ICD-10-CM

## 2014-02-10 NOTE — Therapy (Signed)
[facility] [address] , [place], [address] Phone: [phone]   Fax:  [phone]  Physical Therapy Treatment  Patient Details  Name: Teresa Charles MRN: [id number] Date of Birth: Sep 11, 1960  Encounter Date: 02/10/2014      PT End of Session - 02/10/14 1056    Visit Number 12   Number of Visits 16   Date for PT Re-Evaluation 02/23/14   Authorization Type [employer]   Authorization Time Period 30 visit limit   Authorization - Visit Number 12   Authorization - Number of Visits 16   PT Start Time 1019   PT Stop Time 1059   PT Time Calculation (min) 40 min   Activity Tolerance Patient tolerated treatment well   Behavior During Therapy [facility] for tasks assessed/performed      Past Medical History  Diagnosis Date  . Dysrhythmia     palpitations  . Hypothyroidism   . Anxiety   . Depression   . Arthritis     Past Surgical History  Procedure Laterality Date  . Cardiac catheterization  2009  . Total knee arthroplasty Left 12/08/2013    Procedure: LEFT TOTAL KNEE ARTHROPLASTY;  Surgeon: Shelda PalMatthew D Olin, MD;  Location: [facility];  Service: Orthopedics;  Laterality: Left;    There were no vitals taken for this visit.  Visit Diagnosis:  Knee stiffness, left  Primary osteoarthritis of left knee  Knee pain, left  Difficulty walking  Weakness of left lower extremity      Subjective Assessment - 02/10/14 1024    Symptoms Pt reports generalized muscle soreness in Lt > Rt LE after shooting basketball with her for ~30 minutes.            [facility] - 02/10/14 1039    Assessment   Medical Diagnosis Lt knee stiffness, weakness and pain s/p Lt Knee replacement.    Onset Date 12/08/13   Next MD Visit [name] and [name] 03/06/14   Prior Therapy Home health therapy: [name] was great.           [facility] Adult PT Treatment/Exercise - 02/10/14 1018    Exercises   Exercises Knee/Hip   Knee/Hip Exercises: Stretches   Active  Hamstring Stretch 3 reps;20 seconds   Active Hamstring Stretch Limitations 3 Way to 17" step   Quad Stretch 30 seconds;4 reps   Quad Stretch Limitations  and overpressure from therapist.    Hip Flexor Stretch Limitations 10" x2 with PT assist   Gastroc Stretch 3 reps;30 seconds   Gastroc Stretch Limitations Slantboard   Knee/Hip Exercises: Aerobic   Stationary Bike 5' seat 5   Knee/Hip Exercises: Standing   Heel Raises 2 sets;15 reps  no UE   Functional Squat Limitations Squat reach matrix 5x 1#, chair behind    Other Standing Knee Exercises Retro hamstring walk 5860ft.   Knee/Hip Exercises: Prone   Hip Extension 2 sets;10 reps;Left   Hip Extension Limitations 2#   Prone Knee Hang 3 minutes   Prone Knee Hang Weights (lbs) 2   Other Prone Exercises TKE 15x 5"          PT Education - 02/10/14 1145    Education provided Yes   Education Details Educated pt on techniques for increasing activity tolerance as pt plans to RTW, which involves 8 hours shifts standing/walking   Person(s) Educated Patient   Methods Explanation   Comprehension Verbalized understanding          PT  Short Term Goals - 02/10/14 1056    PT SHORT TERM GOAL #2   Title Patient will displasy imrpoved ankle dorsiflexion to 10 degrees bilaterally to decrease early heel rise during gait    Status On-going   PT SHORT TERM GOAL #3   Title Patient will demosntrate increased knee flexion to >110 degrees to improve squat depth   Status On-going          PT Long Term Goals - 02/10/14 1057    PT LONG TERM GOAL #1   Title Patient will display increased knee extension to 0 degrees to normalize stride length   Status On-going   PT LONG TERM GOAL #2   Title Patient will dispalsy increased ankle dorsiflexion to >15 degrees to decrease early heel rise during gait   Status On-going   PT LONG TERM GOAL #3   Title Patient will display increased knee flexion arom >120 degrees to tormanilze squat depth.    Status  On-going   PT LONG TERM GOAL #4   Title Patient will demonstrate increased knee flexion/extension strength of 5/5 MMT to be able to walk up and downstairs without UE support.   Status On-going          Plan - 02/10/14 1144    Clinical Impression Statement Continued working on ROM, emphasizing knee extesnion > knee flexion today.  Pt reports some generalized soreness after playing basketball, with some swelling last night though today she was able to complete full therapy session without increases in pain.  Pt required some tactile cueing for coordinated movements (squat matrix and retro hamstring walk) as pt had difficulty coordinating UE and LE or Rt LE and Lt LE.   Pt will benefit from skilled therapeutic intervention in order to improve on the following deficits Abnormal gait;Decreased activity tolerance;Decreased endurance;Decreased range of motion;Decreased mobility;Decreased scar mobility;Decreased strength;Difficulty walking   Rehab Potential Good   PT Frequency 2x / week   PT Duration 4 weeks   PT Next Visit Plan Focus on increasing knee extension, secondary focus on increasign knee flexion as well as progressing functional strengthening.         Problem List Patient Active Problem List   Diagnosis Date Noted  . Primary osteoarthritis of left knee 01/01/2014  . Knee stiffness 01/01/2014  . Knee pain, left 01/01/2014  . Difficulty walking 01/01/2014  . S/P left TKA 12/08/2013   Kellie ShropshireStephanie Troi Bechtold, DPT [phone]

## 2014-02-12 ENCOUNTER — Ambulatory Visit (HOSPITAL_COMMUNITY)
Admission: RE | Admit: 2014-02-12 | Discharge: 2014-02-12 | Disposition: A | Discharge status: Home / Self Care | Payer: No Typology Code available for payment source | Source: Ambulatory Visit | Attending: Family Medicine | Admitting: Family Medicine

## 2014-02-12 DIAGNOSIS — M1712 Unilateral primary osteoarthritis, left knee: Secondary | ICD-10-CM

## 2014-02-12 DIAGNOSIS — R262 Difficulty in walking, not elsewhere classified: Secondary | ICD-10-CM

## 2014-02-12 DIAGNOSIS — Z471 Aftercare following joint replacement surgery: Secondary | ICD-10-CM | POA: Diagnosis not present

## 2014-02-12 DIAGNOSIS — M25662 Stiffness of left knee, not elsewhere classified: Secondary | ICD-10-CM

## 2014-02-12 DIAGNOSIS — R29898 Other symptoms and signs involving the musculoskeletal system: Secondary | ICD-10-CM

## 2014-02-12 DIAGNOSIS — M25562 Pain in left knee: Secondary | ICD-10-CM

## 2014-02-12 NOTE — Therapy (Signed)
[facility] [address] [place], [place], [address] Phone: [phone]   Fax:  [phone]  Physical Therapy Treatment  Patient Details  Name: Teresa Charles MRN: [id number] Date of Birth: 10/25/1960  Encounter Date: 02/12/2014      PT End of Session - 02/12/14 1101    PT Start Time 1025   PT Stop Time 1113   PT Time Calculation (min) 48 min      Past Medical History  Diagnosis Date  . Dysrhythmia     palpitations  . Hypothyroidism   . Anxiety   . Depression   . Arthritis     Past Surgical History  Procedure Laterality Date  . Cardiac catheterization  2009  . Total knee arthroplasty Left 12/08/2013    Procedure: LEFT TOTAL KNEE ARTHROPLASTY;  Surgeon: Shelda PalMatthew D Olin, MD;  Location: [facility];  Service: Orthopedics;  Laterality: Left;    There were no vitals taken for this visit.  Visit Diagnosis:  Knee stiffness, left  Primary osteoarthritis of left knee  Knee pain, left  Difficulty walking  Weakness of left lower extremity      Subjective Assessment - 02/12/14 1029    Symptoms Pt states her balance seems off more than anything else; steps are still difficult but she is trying to use it more   Currently in Pain? Yes   Pain Score 2    Pain Location Knee   Pain Orientation Left            [facility] Adult PT Treatment/Exercise - 02/12/14 1030    Exercises   Exercises Knee/Hip   Knee/Hip Exercises: Stretches   Active Hamstring Stretch 3 reps;30 seconds   Active Hamstring Stretch Limitations supine   Piriformis Stretch 1 rep;60 seconds   Piriformis Stretch Limitations seated   Gastroc Stretch 3 reps;30 seconds   Knee/Hip Exercises: Aerobic   Stationary Bike 10' seat 5    Knee/Hip Exercises: Standing   Heel Raises 15 reps   Heel Raises Limitations combined with functional squat   Lateral Step Up Left;15 reps;Hand Hold: 0;Step Height: 6"   Forward Step Up 15 reps;Hand Hold: 0;Step Height: 6"   Functional Squat 15 reps    Stairs 2 RT    SLS with Vectors 3 x 30 seconds    Knee/Hip Exercises: Supine   Terminal Knee Extension 15 reps            PT Short Term Goals - 02/12/14 1054    PT SHORT TERM GOAL #1   Title Patient will display improved Lt knee extension to -5 degrees from full extension to normalize gait   Baseline Knee extension -10 degrees, now 0   Time 4   Period Weeks   Status Achieved   PT SHORT TERM GOAL #2   Title Patient will displasy imrpoved ankle dorsiflexion to 10 degrees bilaterally to decrease early heel rise during gait    Baseline Bilateral ankle dorsiflexion <5 degrees, now 10 degrees   Time 4   Status Achieved   PT SHORT TERM GOAL #3   Title Patient will demosntrate increased knee flexion to >110 degrees to improve squat depth   Baseline 103 degrees knee flexion on Lt LE now 130   Time 4   Period Weeks   Status Achieved          PT Long Term Goals - 02/12/14 1058    PT LONG TERM GOAL #1   Title Patient will display increased knee extension  to 0 degrees to normalize stride length   Baseline -10 degrees   Time 8   Period Weeks   Status Achieved   PT LONG TERM GOAL #2   Title Patient will dispalsy increased ankle dorsiflexion to >15 degrees to decrease early heel rise during gait   Time 8   Status On-going   PT LONG TERM GOAL #3   Title Patient will display increased knee flexion arom >120 degrees to tormanilze squat depth.    Time 8   Period Weeks   Status Achieved   PT LONG TERM GOAL #4   Title Patient will demonstrate increased knee flexion/extension strength of 5/5 MMT to be able to walk up and downstairs without UE support.   Baseline knee flexion/extension strength of 4/5 MMT   Time 8   Status On-going   PT LONG TERM GOAL #5   Title Patient will be able to squat and pivot while lifting 50lb to be able to return to work.    Baseline currently unable due to balance and weakness   Time 4   Status On-going          Plan - 02/12/14 1059     Clinical Impression Statement Pt ROM is now 0-130; He main deficiet is with balance and functional squating.  Added vector stances to address baalance    PT Next Visit Plan continue with balance and functional strength.          Problem List Patient Active Problem List   Diagnosis Date Noted  . Primary osteoarthritis of left knee 01/01/2014  . Knee stiffness 01/01/2014  . Knee pain, left 01/01/2014  . Difficulty walking 01/01/2014  . S/P left TKA 12/08/2013  Donnamae JudeCindy Brendan Gruwell PT/CLT [phone]  02/12/2014, 11:06 AM

## 2014-02-17 ENCOUNTER — Ambulatory Visit (HOSPITAL_COMMUNITY)
Admission: RE | Admit: 2014-02-17 | Discharge: 2014-02-17 | Disposition: A | Discharge status: Home / Self Care | Payer: No Typology Code available for payment source | Source: Ambulatory Visit | Attending: Family Medicine | Admitting: Family Medicine

## 2014-02-17 DIAGNOSIS — M1712 Unilateral primary osteoarthritis, left knee: Secondary | ICD-10-CM

## 2014-02-17 DIAGNOSIS — R262 Difficulty in walking, not elsewhere classified: Secondary | ICD-10-CM

## 2014-02-17 DIAGNOSIS — M25662 Stiffness of left knee, not elsewhere classified: Secondary | ICD-10-CM

## 2014-02-17 DIAGNOSIS — M25562 Pain in left knee: Secondary | ICD-10-CM

## 2014-02-17 DIAGNOSIS — R29898 Other symptoms and signs involving the musculoskeletal system: Secondary | ICD-10-CM

## 2014-02-17 DIAGNOSIS — Z471 Aftercare following joint replacement surgery: Secondary | ICD-10-CM | POA: Diagnosis not present

## 2014-02-17 NOTE — Therapy (Signed)
[facility] [address] [place], [place], [address] Phone: [phone]   Fax:  [phone]  Physical Therapy Treatment  Patient Details  Name: Teresa Charles MRN: [id number] Date of Birth: 07-08-1960  Encounter Date: 02/17/2014      PT End of Session - 02/17/14 1058    Visit Number 14   Number of Visits 16   Date for PT Re-Evaluation 02/23/14   Authorization Type [employer]   Authorization Time Period 30 visit limit   Authorization - Visit Number 14   Authorization - Number of Visits 16   PT Start Time 1015   PT Stop Time 1101   PT Time Calculation (min) 46 min   Activity Tolerance Patient tolerated treatment well   Behavior [facility] Therapy [facility] for tasks assessed/performed      Past Medical History  Diagnosis Date  . Dysrhythmia     palpitations  . Hypothyroidism   . Anxiety   . Depression   . Arthritis     Past Surgical History  Procedure Laterality Date  . Cardiac catheterization  2009  . Total knee arthroplasty Left 12/08/2013    Procedure: LEFT TOTAL KNEE ARTHROPLASTY;  Surgeon: Shelda PalMatthew D Olin, MD;  Location: [facility];  Service: Orthopedics;  Laterality: Left;    There were no vitals taken for this visit.  Visit Diagnosis:  No diagnosis found.      Subjective Assessment - 02/17/14 1018    Symptoms Pt reprots mild complaints of pain, and "puffiness" around knee.    Currently in Pain? Yes   Pain Score 2    Pain Location Knee   Pain Orientation Left          [facility] PT Assessment - 02/17/14 1020    Assessment   Medical Diagnosis Lt knee stiffness, weakness and pain s/p Lt Knee replacement.    Onset Date 12/08/13   Next MD Visit [name] and [name] 03/06/14                  [facility] Adult PT Treatment/Exercise - 02/17/14 0001    Exercises   Exercises Knee/Hip   Knee/Hip Exercises: Stretches   Active Hamstring Stretch 3 reps;20 seconds   Active Hamstring Stretch Limitations 3 Way to 17" box    Quad Stretch 3 reps;30 seconds   Quad Stretch Limitations prone with rope   Gastroc Stretch 3 reps;30 seconds   Gastroc Stretch Limitations Slantboard   Knee/Hip Exercises: Aerobic   Stationary Bike 5' seat 4   Knee/Hip Exercises: Plyometrics   Other Plyometric Exercises Ladder (1) Forward, (2) Sideways, (3) In/Out x2 each   Knee/Hip Exercises: Standing   Heel Raises 15 reps   Heel Raises Limitations on Airex with HR, x5 neutral x5 staggered stance each   Forward Lunges 10 reps;Left   Forward Lunges Limitations BOSU   Side Lunges 10 reps;Left   Side Lunges Limitations BOSU   Lateral Step Up 15 reps;Hand Hold: 0;Left   Lateral Step Up Limitations 4" step+Airex   Forward Step Up 15 reps;Hand Hold: 0;Left   Forward Step Up Limitations 4" step+Airex   Functional Squat 10 reps   Functional Squat Limitations BOSU   Stairs 7" RT, no UE working on Microsoftcontrolled/slow descent   Other Standing Knee Exercises Monster Walk RTB 30' RT                  PT Short Term Goals - 02/12/14 1054    PT SHORT TERM  GOAL #1   Title Patient will display improved Lt knee extension to -5 degrees from full extension to normalize gait   Baseline Knee extension -10 degrees, now 0   Time 4   Period Weeks   Status Achieved   PT SHORT TERM GOAL #2   Title Patient will displasy imrpoved ankle dorsiflexion to 10 degrees bilaterally to decrease early heel rise during gait    Baseline Bilateral ankle dorsiflexion <5 degrees, now 10 degrees   Time 4   Status Achieved   PT SHORT TERM GOAL #3   Title Patient will demosntrate increased knee flexion to >110 degrees to improve squat depth   Baseline 103 degrees knee flexion on Lt LE now 130   Time 4   Period Weeks   Status Achieved           PT Long Term Goals - 02/17/14 1020    PT LONG TERM GOAL #2   Title Patient will dispalsy increased ankle dorsiflexion to >15 degrees to decrease early heel rise during gait   Status On-going   PT LONG TERM GOAL  #3   Title Patient will display increased knee flexion arom >120 degrees to tormanilze squat depth.    PT LONG TERM GOAL #4   Title Patient will demonstrate increased knee flexion/extension strength of 5/5 MMT to be able to walk up and downstairs without UE support.   Status On-going           Plan - 02/17/14 1058    Clinical Impression Statement Challneged strengthening program on uneven terrain as able, incorporating exercises on BOSU and Airex today.  VC throughout session to avoid looking down, and working on weight shifting/gait with eyes scanning vs. looking  on ladder drill.     Pt will benefit from skilled therapeutic intervention in order to improve on the following deficits Abnormal gait;Decreased activity tolerance;Decreased endurance;Decreased range of motion;Decreased mobility;Decreased scar mobility;Decreased strength;Difficulty walking   Rehab Potential Good   PT Frequency 2x / week   PT Duration 4 weeks   PT Next Visit Plan Continued challenged balance/strengthening program using uneven terrain to challenge as able.         Problem List Patient Active Problem List   Diagnosis Date Noted  . Primary osteoarthritis of left knee 01/01/2014  . Knee stiffness 01/01/2014  . Knee pain, left 01/01/2014  . Difficulty walking 01/01/2014  . S/P left TKA 12/08/2013   Kellie ShropshireStephanie Granville [place]field, DPT [phone]  [facility] [address] [place], [place], [address] Phone: [phone]   Fax:  [phone]

## 2014-02-19 ENCOUNTER — Ambulatory Visit (HOSPITAL_COMMUNITY): Payer: No Typology Code available for payment source

## 2014-02-24 ENCOUNTER — Ambulatory Visit (HOSPITAL_COMMUNITY)
Admission: RE | Admit: 2014-02-24 | Discharge: 2014-02-24 | Disposition: A | Discharge status: Home / Self Care | Payer: No Typology Code available for payment source | Source: Ambulatory Visit | Attending: Family Medicine | Admitting: Family Medicine

## 2014-02-24 DIAGNOSIS — M1712 Unilateral primary osteoarthritis, left knee: Secondary | ICD-10-CM

## 2014-02-24 DIAGNOSIS — M25662 Stiffness of left knee, not elsewhere classified: Secondary | ICD-10-CM

## 2014-02-24 DIAGNOSIS — R29898 Other symptoms and signs involving the musculoskeletal system: Secondary | ICD-10-CM

## 2014-02-24 DIAGNOSIS — M25562 Pain in left knee: Secondary | ICD-10-CM

## 2014-02-24 DIAGNOSIS — Z471 Aftercare following joint replacement surgery: Secondary | ICD-10-CM | POA: Diagnosis not present

## 2014-02-24 DIAGNOSIS — R262 Difficulty in walking, not elsewhere classified: Secondary | ICD-10-CM

## 2014-02-24 NOTE — Therapy (Signed)
[place], [place], [address] Phone: [phone]   Fax:  [phone]  Physical Therapy Treatment  Patient Details  Name: Teresa Charles MRN: [id number] Date of Birth: 02/01/1961  Encounter Date: 02/24/2014      PT End of Session - 02/24/14 1032    Visit Number 15   Number of Visits 16   Date for PT Re-Evaluation 02/23/14   Authorization Type [employer]   Authorization Time Period 30 visit limit   Authorization - Visit Number 15   Authorization - Number of Visits 16   PT Start Time 1020   PT Stop Time 1110   PT Time Calculation (min) 50 min   Activity Tolerance Patient tolerated treatment well   Behavior During Therapy [facility] for tasks assessed/performed      Past Medical History  Diagnosis Date  . Dysrhythmia     palpitations  . Hypothyroidism   . Anxiety   . Depression   . Arthritis     Past Surgical History  Procedure Laterality Date  . Cardiac catheterization  2009  . Total knee arthroplasty Left 12/08/2013    Procedure: LEFT TOTAL KNEE ARTHROPLASTY;  Surgeon: Mauri Pole, MD;  Location: [facility];  Service: Orthopedics;  Laterality: Left;    There were no vitals taken for this visit.  Visit Diagnosis:  Knee stiffness, left  Primary osteoarthritis of left knee  Knee pain, left  Difficulty walking  Weakness of left lower extremity      Subjective Assessment - 02/24/14 1028    Symptoms Patient reports no pain, notes cotnined slight difficutly balancing but overall much improved patyient states having no difficulties with any activities.    Currently in Pain? No/denies          [facility] PT Assessment - 02/24/14 0001    Assessment   Medical Diagnosis Lt knee stiffness, weakness and pain s/p Lt Knee replacement.    Onset Date 12/08/13   Next MD Visit Gerrit Halls and Doctor Alvan Dame 03/06/14   Observation/Other Assessments   Observations Gait:   Focus on Therapeutic Outcomes (FOTO)  1%  limited, 99% functional   AROM   Left Hip Flexion 135   Left Hip External Rotation  40   Left Hip Internal Rotation  40   Left Knee Extension 0   Left Knee Flexion 130   Right Ankle Dorsiflexion 15   Left Ankle Dorsiflexion 17   Strength   Right Hip Flexion 5/5   Left Hip Flexion 5/5   Right Knee Flexion 5/5   Right Knee Extension 5/5   Left Knee Flexion 5/5  4+/5   Left Knee Extension 5/5   Right Ankle Dorsiflexion 5/5   Left Ankle Dorsiflexion 5/5   Flexibility   Hamstrings WNL   Quadriceps Limited   Thomas Test    Findings Negative   Side Left;Right   Ober's Test   Findings Negative   Side Left;Right   Piriformis Test   Findings Negative   Side  Left;Right   other   Findings Positive   Side Left   Comments Ely's test                  [facility] Adult PT Treatment/Exercise - 02/24/14 0001    Knee/Hip Exercises: Stretches   Active Hamstring Stretch 3 reps;20 seconds   Active Hamstring Stretch Limitations 3 Way to 17" box   Quad Stretch 3 reps;30 seconds   Quad Stretch Limitations prone  with rope   Gastroc Stretch 3 reps;30 seconds   Gastroc Stretch Limitations Slantboard   Knee/Hip Exercises: Standing   Heel Raises 15 reps   Heel Raises Limitations on Airex beam x5 neutral x5 staggered stance each   Forward Lunges 10 reps;Left   Forward Lunges Limitations BOSU   Side Lunges 10 reps;Left   Side Lunges Limitations BOSU   Other Standing Knee Exercises Squat reach matrix 5x with 5lb dumbbells   Other Standing Knee Exercises big reach walk                PT Education - 02/24/14 1113    Education provided Yes   Education Details Educated on HEP performance, importance of proper shoes   Person(s) Educated Patient   Methods Explanation;Demonstration;Tactile cues;Handout;Verbal cues   Comprehension Verbalized understanding;Returned demonstration          PT Short Term Goals - 02/24/14 1115    PT SHORT TERM GOAL #1   Title Patient will display  improved Lt knee extension to -5 degrees from full extension to normalize gait   Status Achieved   PT SHORT TERM GOAL #2   Title Patient will displasy imrpoved ankle dorsiflexion to 10 degrees bilaterally to decrease early heel rise during gait    Status Achieved   PT SHORT TERM GOAL #3   Title Patient will demosntrate increased knee flexion to >110 degrees to improve squat depth   Status Achieved           PT Long Term Goals - 02/24/14 1115    PT LONG TERM GOAL #1   Title Patient will display increased knee extension to 0 degrees to normalize stride length   Status Achieved   PT LONG TERM GOAL #2   Title Patient will dispalsy increased ankle dorsiflexion to >15 degrees to decrease early heel rise during gait   Status Achieved   PT LONG TERM GOAL #3   Title Patient will display increased knee flexion arom >120 degrees to tormanilze squat depth.    Status Achieved   PT LONG TERM GOAL #4   Title Patient will demonstrate increased knee flexion/extension strength of 5/5 MMT to be able to walk up and downstairs without UE support.   Status Achieved   PT LONG TERM GOAL #5   Title Patient will be able to squat and pivot while lifting 50lb to be able to return to work.    Status Achieved               Plan - 02/24/14 1113    Clinical Impression Statement Patient has met all goals and and demosntrates independence with all exercises for HEP. Patient states readiness and exciotement for discharge. Patient was instructed on HEP perofrmance and demosntrated independnece with good techniques and form for all exercises with minimal cuing.    PT Next Visit Plan Patient discharged with HEP.        Problem List Patient Active Problem List   Diagnosis Date Noted  . Primary osteoarthritis of left knee 01/01/2014  . Knee stiffness 01/01/2014  . Knee pain, left 01/01/2014  . Difficulty walking 01/01/2014  . S/P left TKA 12/08/2013    Devona Konig PT DPT [phone]  PHYSICAL  THERAPY DISCHARGE SUMMARY  Visits from Start of Care: 14  Current functional level related to goals / functional outcomes: All goals achieved  Plan: Patient agrees to discharge.  Patient goals were met. Patient is being discharged due to meeting the stated rehab goals.  ?????  Devona Konig PT DPT [place] [address] [place], [place], [address] Phone: [phone]   Fax:  [phone]

## 2014-02-26 ENCOUNTER — Ambulatory Visit (HOSPITAL_COMMUNITY)
Admission: RE | Admit: 2014-02-26 | Payer: No Typology Code available for payment source | Source: Ambulatory Visit | Attending: Family Medicine | Admitting: Family Medicine

## 2014-10-20 ENCOUNTER — Other Ambulatory Visit (HOSPITAL_COMMUNITY): Payer: Self-pay | Admitting: Physician Assistant

## 2014-10-20 ENCOUNTER — Ambulatory Visit (HOSPITAL_COMMUNITY)
Admission: RE | Admit: 2014-10-20 | Discharge: 2014-10-20 | Disposition: A | Discharge status: Home / Self Care | Payer: No Typology Code available for payment source | Source: Ambulatory Visit | Attending: Physician Assistant | Admitting: Physician Assistant

## 2014-10-20 DIAGNOSIS — J209 Acute bronchitis, unspecified: Secondary | ICD-10-CM

## 2014-10-20 DIAGNOSIS — R0989 Other specified symptoms and signs involving the circulatory and respiratory systems: Secondary | ICD-10-CM | POA: Diagnosis not present

## 2014-10-20 DIAGNOSIS — R079 Chest pain, unspecified: Secondary | ICD-10-CM | POA: Diagnosis not present

## 2014-10-20 DIAGNOSIS — F17210 Nicotine dependence, cigarettes, uncomplicated: Secondary | ICD-10-CM | POA: Insufficient documentation

## 2014-10-20 DIAGNOSIS — R059 Cough, unspecified: Secondary | ICD-10-CM

## 2014-10-20 DIAGNOSIS — R05 Cough: Secondary | ICD-10-CM | POA: Insufficient documentation

## 2014-10-20 DIAGNOSIS — R0602 Shortness of breath: Secondary | ICD-10-CM | POA: Diagnosis not present

## 2014-12-02 ENCOUNTER — Other Ambulatory Visit (HOSPITAL_COMMUNITY): Payer: Self-pay | Admitting: Physician Assistant

## 2014-12-02 DIAGNOSIS — Z1231 Encounter for screening mammogram for malignant neoplasm of breast: Secondary | ICD-10-CM

## 2014-12-21 ENCOUNTER — Ambulatory Visit (HOSPITAL_COMMUNITY)
Admission: RE | Admit: 2014-12-21 | Discharge: 2014-12-21 | Disposition: A | Discharge status: Home / Self Care | Payer: No Typology Code available for payment source | Source: Ambulatory Visit | Attending: Physician Assistant | Admitting: Physician Assistant

## 2014-12-21 DIAGNOSIS — Z1231 Encounter for screening mammogram for malignant neoplasm of breast: Secondary | ICD-10-CM | POA: Diagnosis present

## 2015-12-12 IMAGING — MG MM DIGITAL SCREENING
4 series · 4 of 4 positions shown · non-contrast
Comparison: Previous exam(s).

CLINICAL DATA: Screening.

EXAM:
DIGITAL SCREENING BILATERAL MAMMOGRAM WITH CAD

[L CC]
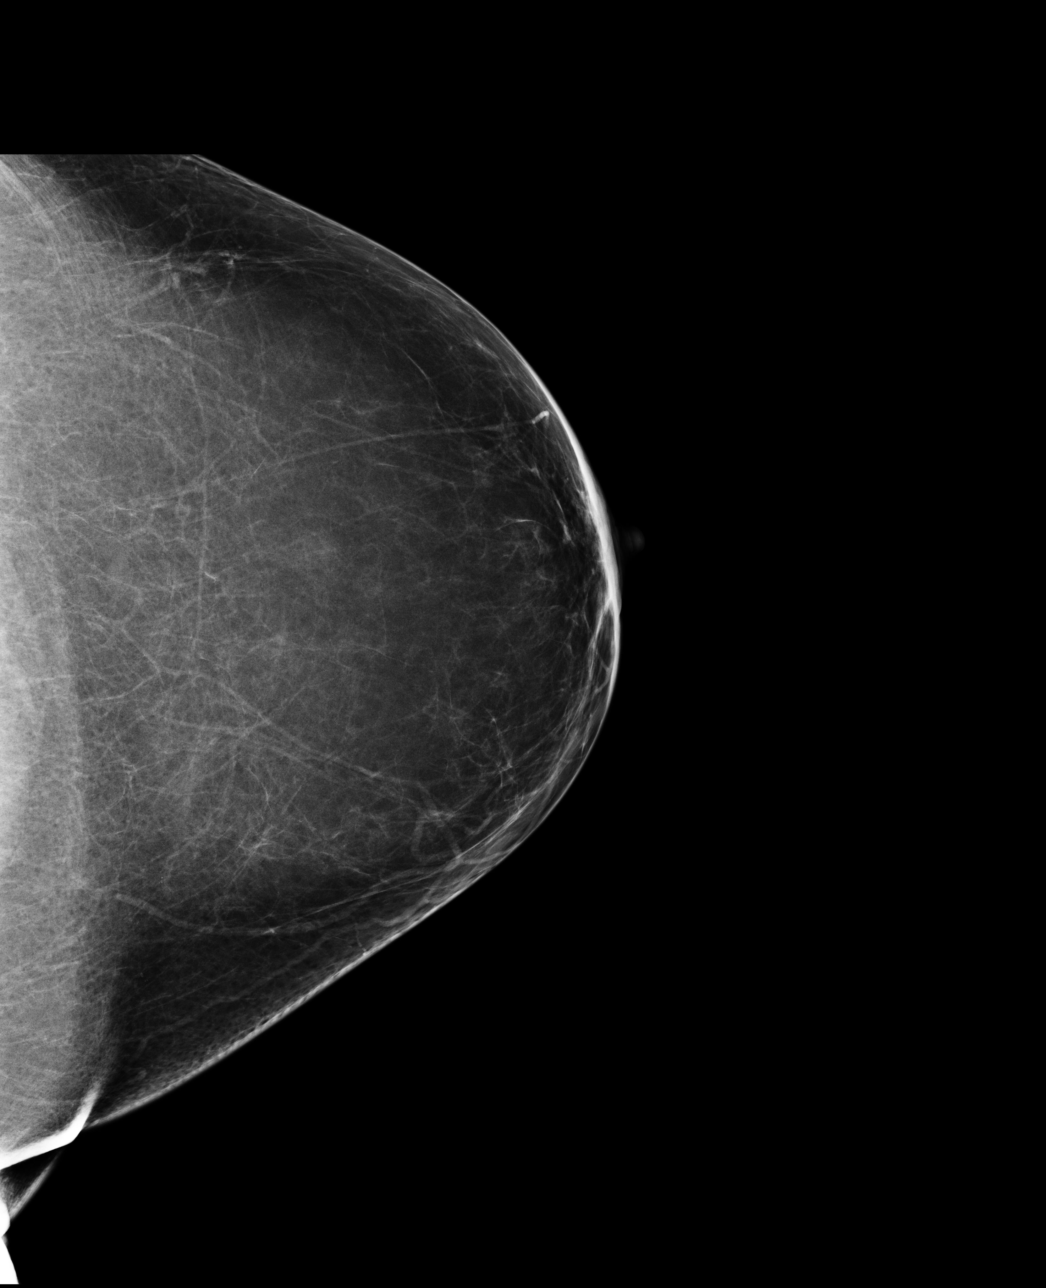

[L MLO]
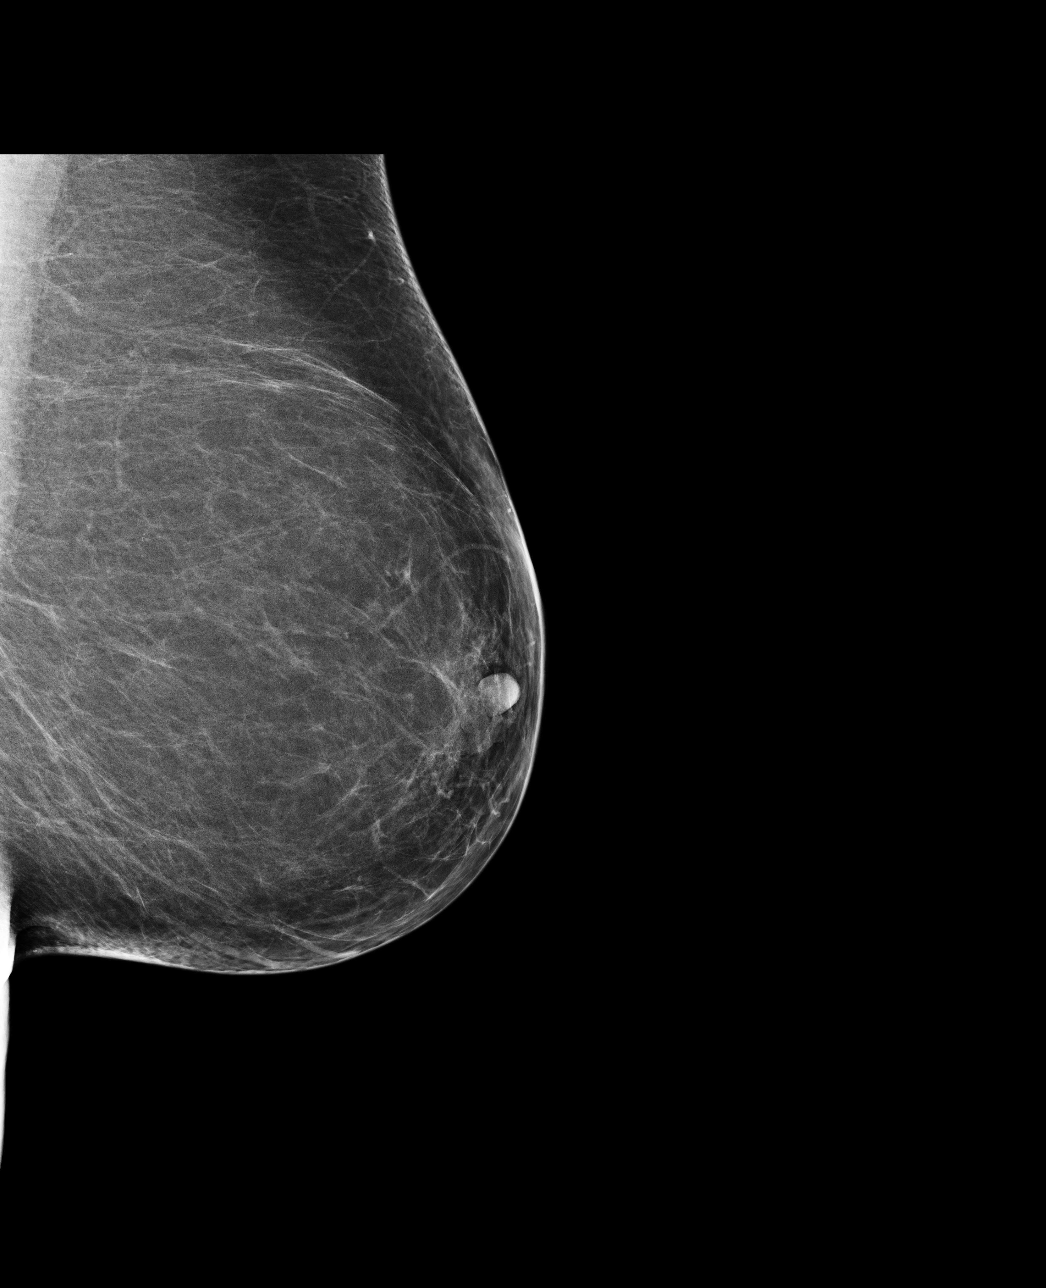

[R CC]
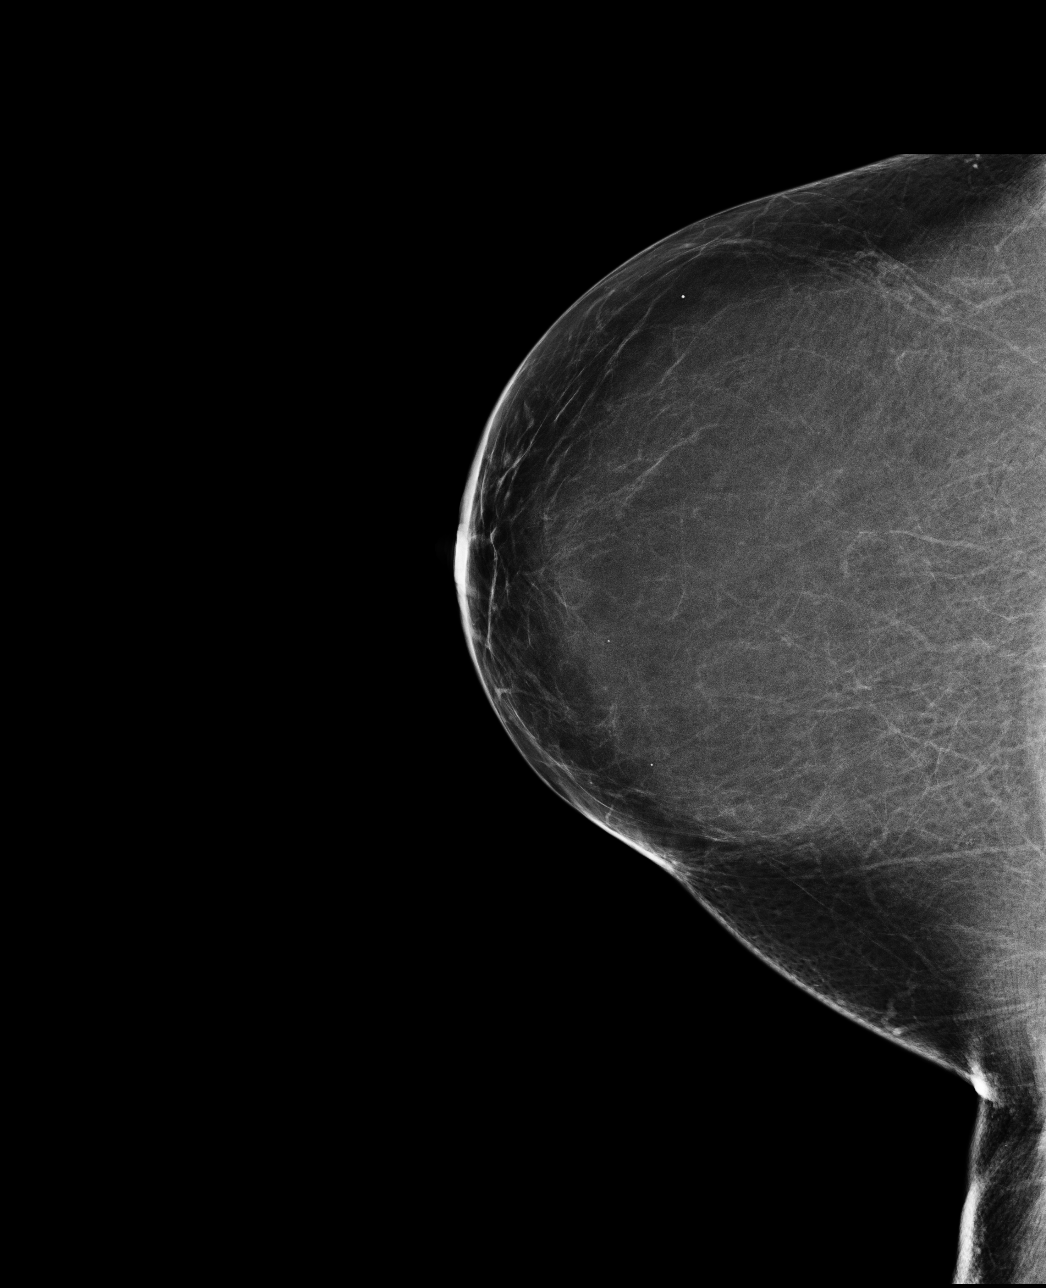

[R MLO]
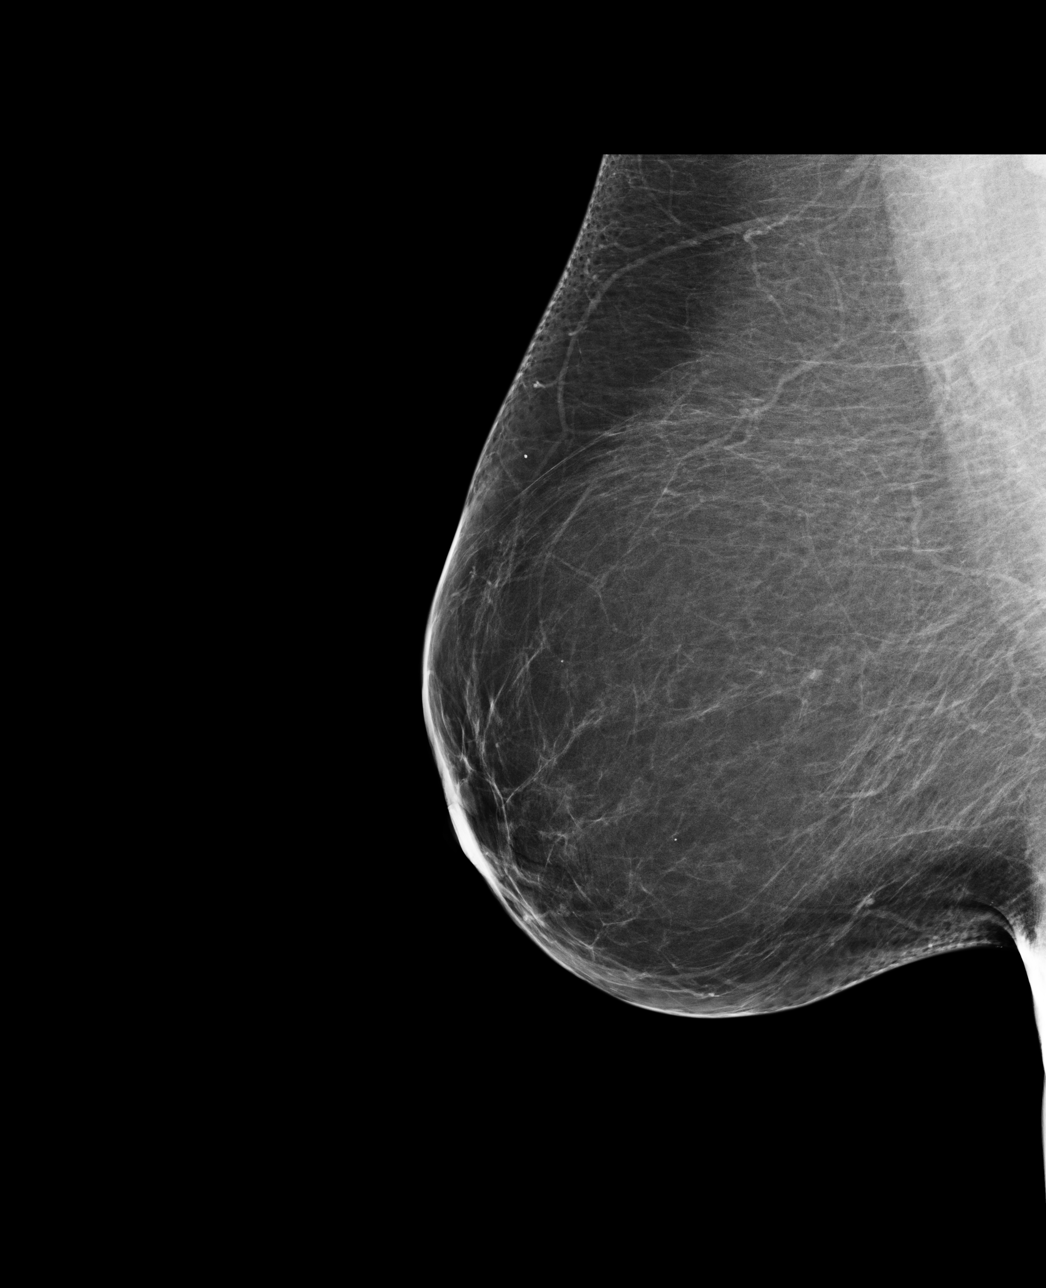

[4 of 4 positions shown; findings below may reference images not displayed]

ACR Breast Density Category b: There are scattered areas of
fibroglandular density.
FINDINGS: There are no findings suspicious for malignancy. Images were
processed with CAD.
IMPRESSION: No mammographic evidence of malignancy. A result letter of this
screening mammogram will be mailed directly to the patient.

RECOMMENDATION:
Screening mammogram in one year. (Code:GW-[id number])

BI-RADS CATEGORY  1: Negative

## 2015-12-18 ENCOUNTER — Emergency Department (HOSPITAL_COMMUNITY): Payer: PRIVATE HEALTH INSURANCE

## 2015-12-18 ENCOUNTER — Emergency Department (HOSPITAL_COMMUNITY)
Admission: EM | Admit: 2015-12-18 | Discharge: 2015-12-18 | Disposition: A | Discharge status: Home / Self Care | Payer: PRIVATE HEALTH INSURANCE | Attending: Emergency Medicine | Admitting: Emergency Medicine

## 2015-12-18 ENCOUNTER — Encounter (HOSPITAL_COMMUNITY): Payer: Self-pay | Admitting: Emergency Medicine

## 2015-12-18 DIAGNOSIS — S8001XA Contusion of right knee, initial encounter: Secondary | ICD-10-CM | POA: Diagnosis not present

## 2015-12-18 DIAGNOSIS — S2232XA Fracture of one rib, left side, initial encounter for closed fracture: Secondary | ICD-10-CM | POA: Insufficient documentation

## 2015-12-18 DIAGNOSIS — Z79899 Other long term (current) drug therapy: Secondary | ICD-10-CM | POA: Diagnosis not present

## 2015-12-18 DIAGNOSIS — Z87891 Personal history of nicotine dependence: Secondary | ICD-10-CM | POA: Diagnosis not present

## 2015-12-18 DIAGNOSIS — Y990 Civilian activity done for income or pay: Secondary | ICD-10-CM | POA: Diagnosis not present

## 2015-12-18 DIAGNOSIS — Y9389 Activity, other specified: Secondary | ICD-10-CM | POA: Diagnosis not present

## 2015-12-18 DIAGNOSIS — Y929 Unspecified place or not applicable: Secondary | ICD-10-CM | POA: Diagnosis not present

## 2015-12-18 DIAGNOSIS — S299XXA Unspecified injury of thorax, initial encounter: Secondary | ICD-10-CM | POA: Diagnosis present

## 2015-12-18 DIAGNOSIS — E039 Hypothyroidism, unspecified: Secondary | ICD-10-CM | POA: Insufficient documentation

## 2015-12-18 DIAGNOSIS — W010XXA Fall on same level from slipping, tripping and stumbling without subsequent striking against object, initial encounter: Secondary | ICD-10-CM | POA: Insufficient documentation

## 2015-12-18 DIAGNOSIS — W19XXXA Unspecified fall, initial encounter: Secondary | ICD-10-CM

## 2015-12-18 MED ORDER — HYDROCODONE-ACETAMINOPHEN 5-325 MG PO TABS: 1.0000 | ORAL_TABLET | ORAL | 0 refills | Status: DC | PRN

## 2015-12-18 MED ORDER — CYCLOBENZAPRINE HCL 10 MG PO TABS
10.0000 mg | ORAL_TABLET | Freq: Once | ORAL | Status: AC
Start: 2015-12-18 — End: 2015-12-18
  Administered 2015-12-18: 10 mg via ORAL
  Filled 2015-12-18: qty 1

## 2015-12-18 NOTE — Discharge Instructions (Signed)
Follow up with Employee Health. Return here as needed.

## 2015-12-18 NOTE — ED Triage Notes (Signed)
Pt states she slipped in a puddle of water whila at work. Pt states her R leg went back and she arched her body back. Pt c/o pain in her R leg and up the L side of her body from her groin up into her chest.

## 2015-12-18 NOTE — ED Provider Notes (Signed)
[facility] DEPT Provider Note   CSN: [id number] Arrival date & time: 12/18/15  1529     History   Chief Complaint Chief Complaint  Patient presents with  . Fall    HPI Teresa Charles is a 55 y.o. female who presents to the ED s/p fall here at work today. Patient reports that she slipped in a puddle of water and her feet went out from under her. She landed with on her right knee with her leg bent under her. She had pain to the left rib area. Patient reports that she does not think she hit her left ribs because she landed on her right side. She denies loss of control of bladder or bowels. No LOC or head injury. The fall happened at 0730 and she continued to work her full shift today. Now the pain in her side is worse. Patient had taken Naprosyn prior to arrival to work this morning but has not had any medication since then.   The history is provided by the patient. No language interpreter was used.  Fall  This is a new problem. The current episode started 6 to 12 hours ago. The problem occurs constantly. The problem has been gradually worsening. Pertinent negatives include no abdominal pain, no headaches and no shortness of breath. Associated symptoms comments: Left rib pain, right knee pain. The symptoms are aggravated by walking, bending, twisting and standing. Nothing relieves the symptoms.    Past Medical History:  Diagnosis Date  . Anxiety   . Arthritis   . Depression   . Dysrhythmia    palpitations  . Hypothyroidism     Patient Active Problem List   Diagnosis Date Noted  . Primary osteoarthritis of left knee 01/01/2014  . Knee stiffness 01/01/2014  . Knee pain, left 01/01/2014  . Difficulty walking 01/01/2014  . S/P left TKA 12/08/2013    Past Surgical History:  Procedure Laterality Date  . CARDIAC CATHETERIZATION  2009  . TOTAL KNEE ARTHROPLASTY Left 12/08/2013   Procedure: LEFT TOTAL KNEE ARTHROPLASTY;  Surgeon: Shelda Pal, MD;  Location: [facility];  Service:  Orthopedics;  Laterality: Left;    OB History    Gravida Para Term Preterm AB Living   3 3 3          SAB TAB Ectopic Multiple Live Births                   Home Medications    Prior to Admission medications   Medication Sig Start Date End Date Taking? Authorizing Provider  ALPRAZolam Prudy Feeler) 0.5 MG tablet Take 1 mg by mouth at bedtime as needed for anxiety or sleep.    Historical Provider, MD  buPROPion (WELLBUTRIN) 100 MG tablet Take 100 mg by mouth every morning.    Historical Provider, MD  cephALEXin (KEFLEX) 500 MG capsule Take 1 capsule (500 mg total) by mouth 3 (three) times daily. 12/08/13   Lanney Gins, PA-C  diphenhydrAMINE (BENADRYL) 25 MG tablet Take 50 mg by mouth at bedtime as needed for sleep.    Historical Provider, MD  docusate sodium (COLACE) 100 MG capsule Take 1 capsule (100 mg total) by mouth 2 (two) times daily. 12/08/13   Lanney Gins, PA-C  ferrous sulfate 325 (65 FE) MG tablet Take 1 tablet (325 mg total) by mouth daily with breakfast. 12/08/13   Lanney Gins, PA-C  HYDROcodone-acetaminophen (NORCO/VICODIN) 5-325 MG tablet Take 1 tablet by mouth every 4 (four) hours as needed. 12/18/15   Hope  Orlene OchM Neese, NP  levothyroxine (SYNTHROID, LEVOTHROID) 125 MCG tablet Take 125 mcg by mouth daily before breakfast.    Historical Provider, MD  methocarbamol (ROBAXIN) 500 MG tablet Take 1 tablet (500 mg total) by mouth every 6 (six) hours as needed for muscle spasms. 12/08/13   Lanney GinsMatthew Babish, PA-C  metoprolol tartrate (LOPRESSOR) 25 MG tablet Take 25 mg by mouth 2 (two) times daily.    Historical Provider, MD  oxyCODONE (OXY IR/ROXICODONE) 5 MG immediate release tablet Take 1-3 tablets (5-15 mg total) by mouth every 4 (four) hours as needed for moderate pain or severe pain. 12/08/13   Lanney GinsMatthew Babish, PA-C  polyethylene glycol (MIRALAX / GLYCOLAX) packet Take 17 g by mouth daily. 12/08/13   Lanney GinsMatthew Babish, PA-C  rOPINIRole (REQUIP) 1 MG tablet Take 1 mg by mouth at  bedtime. For restless legs.    Historical Provider, MD    Family History Family History  Problem Relation Age of Onset  . Cancer Brother     Social History Social History  Substance Use Topics  . Smoking status: Former Smoker    Packs/day: 1.00    Years: 35.00    Types: Cigarettes  . Smokeless tobacco: Never Used  . Alcohol use No     Allergies   Review of patient's allergies indicates no known allergies.   Review of Systems Review of Systems  Constitutional: Negative for diaphoresis.  HENT: Negative.   Eyes: Negative for visual disturbance.  Respiratory: Negative for shortness of breath.   Cardiovascular:       Left rib pain  Gastrointestinal: Negative for abdominal pain, nausea and vomiting.  Genitourinary: Negative for urgency.  Musculoskeletal: Negative for neck pain.       Right knee pain  Skin: Positive for wound.       Abrasion to right knee  Neurological: Negative for syncope and headaches.  Psychiatric/Behavioral: Negative for confusion. The patient is not nervous/anxious.      Physical Exam Updated Vital Signs BP 127/72 (BP Location: Left Arm)   Pulse 73   Temp 98.1 F (36.7 C) (Oral)   Resp 16   Ht 5\' 7"  (1.702 m)   Wt 81.6 kg   SpO2 99%   BMI 28.19 kg/m   Physical Exam  Constitutional: She is oriented to person, place, and time. She appears well-developed and well-nourished.  HENT:  Right Ear: Tympanic membrane normal.  Left Ear: Tympanic membrane normal.  Nose: Nose normal.  Mouth/Throat: Uvula is midline, oropharynx is clear and moist and mucous membranes are normal.  Eyes: EOM are normal. Pupils are equal, round, and reactive to light.  Neck: Normal range of motion. Neck supple.  Cardiovascular: Normal rate and regular rhythm.   Pulmonary/Chest: Effort normal and breath sounds normal. No respiratory distress. She exhibits tenderness.    Tender on palpation of the left rib area.   Abdominal: Soft. There is no tenderness.    Musculoskeletal: Normal range of motion.       Right knee: She exhibits normal range of motion, normal alignment and normal patellar mobility. Swelling: minimal  Lacerations: abrasion. Tenderness found.  Radial pulses 2+, pedal pulses 2+, adequate circulation.  Neurological: She is alert and oriented to person, place, and time. She has normal strength. No cranial nerve deficit or sensory deficit. Gait normal.  Reflex Scores:      Bicep reflexes are 2+ on the right side and 2+ on the left side.      Brachioradialis reflexes are 2+ on the  right side and 2+ on the left side.      Patellar reflexes are 2+ on the right side and 2+ on the left side. Skin: Skin is warm and dry.  Psychiatric: She has a normal mood and affect. Her behavior is normal.  Nursing note and vitals reviewed.    ED Treatments / Results  Labs Radiology Dg Ribs Unilateral W/chest Left  Result Date: 12/18/2015 CLINICAL DATA:  Fall this morning with left anterior rib pain. EXAM: LEFT RIBS AND CHEST - 3+ VIEW COMPARISON:  10/20/2014 FINDINGS: Lungs are adequately inflated without consolidation, effusion or pneumothorax. Cardiomediastinal silhouette is within normal. There is a very subtle fracture along anterior lateral aspect of the left sixth rib. IMPRESSION: No acute cardiopulmonary disease per Subtle fracture without significant displacement involving the anterior lateral left sixth rib. Electronically Signed   By: Elberta Fortis M.D.   On: 12/18/2015 16:44   Dg Knee Complete 4 Views Right  Result Date: 12/18/2015 CLINICAL DATA:  Recent fall with right knee pain, initial encounter EXAM: RIGHT KNEE - COMPLETE 4+ VIEW COMPARISON:  None. FINDINGS: Tricompartmental degenerative changes are noted particularly in the medial joint space with joint space narrowing and osteophytic changes. No significant effusion is seen. No acute fracture is noted. IMPRESSION: Degenerative changes without acute abnormality. Electronically Signed    By: Alcide Clever M.D.   On: 12/18/2015 16:00    Procedures Procedures (including critical care time)  Medications Ordered in ED Medications  cyclobenzaprine (FLEXERIL) tablet 10 mg (10 mg Oral Given 12/18/15 1633)     Initial Impression / Assessment and Plan / ED Course  I have reviewed the triage vital signs and the nursing notes.  Pertinent  imaging results that were available during my care of the patient were reviewed by me and considered in my medical decision making (see chart for details).  Clinical Course   Final Clinical Impressions(s) / ED Diagnoses  55 y.o. female with right knee and left rib pain s/p fall today at work stable for d/c without focal neuro deficits. Will treat for pain and she will f/u with [facility]. She will return here as needed for problems.  Final diagnoses:  Closed fracture of one rib of left side, initial encounter  Contusion of right knee, initial encounter  Fall, initial encounter    New Prescriptions New Prescriptions   HYDROCODONE-ACETAMINOPHEN (NORCO/VICODIN) 5-325 MG TABLET    Take 1 tablet by mouth every 4 (four) hours as needed.     Bas[place], NP 12/18/15 1708    Marily Memos, MD 12/18/15 2033

## 2016-01-12 ENCOUNTER — Other Ambulatory Visit: Payer: Self-pay | Admitting: Occupational Medicine

## 2016-01-12 ENCOUNTER — Ambulatory Visit: Payer: Self-pay

## 2016-01-12 DIAGNOSIS — M25551 Pain in right hip: Secondary | ICD-10-CM

## 2016-01-12 DIAGNOSIS — R0781 Pleurodynia: Secondary | ICD-10-CM

## 2016-05-16 ENCOUNTER — Ambulatory Visit (HOSPITAL_COMMUNITY)
Admission: RE | Admit: 2016-05-16 | Discharge: 2016-05-16 | Disposition: A | Discharge status: Home / Self Care | Payer: No Typology Code available for payment source | Source: Ambulatory Visit | Attending: Registered Nurse | Admitting: Registered Nurse

## 2016-05-16 ENCOUNTER — Other Ambulatory Visit (HOSPITAL_COMMUNITY): Payer: Self-pay | Admitting: Registered Nurse

## 2016-05-16 DIAGNOSIS — R109 Unspecified abdominal pain: Secondary | ICD-10-CM | POA: Diagnosis not present

## 2016-05-16 DIAGNOSIS — Y29XXXA Contact with blunt object, undetermined intent, initial encounter: Secondary | ICD-10-CM

## 2016-08-18 ENCOUNTER — Telehealth (HOSPITAL_COMMUNITY): Payer: Self-pay | Admitting: Physical Therapy

## 2016-08-18 NOTE — Telephone Encounter (Signed)
Called Dr. Nilsa Nuttinglin's office talked with Selena BattenKim - She will send a note up and ask if the pt can start 7/3 or 7/6 for PT after surgery  on 7/2 due to patient's request. MD order states to start on 08/31/16 and we do not have any openings on that day. NF 08/18/16

## 2016-08-22 ENCOUNTER — Telehealth (HOSPITAL_COMMUNITY): Payer: Self-pay | Admitting: Physician Assistant

## 2016-08-22 NOTE — H&P (Signed)
TOTAL KNEE ADMISSION H&P  Patient is being admitted for right total knee arthroplasty.  Subjective:  Chief Complaint:   Right knee primary OA / pain  HPI: Teresa Charles, 56 y.o. female, has a history of pain and functional disability in the right knee due to arthritis and has failed non-surgical conservative treatments for greater than 12 weeks to include NSAID's and/or analgesics, corticosteriod injections and activity modification.  Onset of symptoms was gradual, starting  years ago with gradually worsening course since that time. The patient noted prior procedures on the knee to include  arthroplasty on the left knee(s).  Patient currently rates pain in the right knee(s) at 10 out of 10 with activity. Patient has night pain, worsening of pain with activity and weight bearing, pain that interferes with activities of daily living, pain with passive range of motion, crepitus and joint swelling.  Patient has evidence of periarticular osteophytes and joint space narrowing by imaging studies.  There is no active infection.   Risks, benefits and expectations were discussed with the patient.  Risks including but not limited to the risk of anesthesia, blood clots, nerve damage, blood vessel damage, failure of the prosthesis, infection and up to and including death.  Patient understand the risks, benefits and expectations and wishes to proceed with surgery.   PCP: Shawnie DapperMann, Benjamin L, PA-C  D/C Plans:       Home (same day ?)  Post-op Meds:       No Rx given   Tranexamic Acid:      To be given - IV   Decadron:      Is to be given  FYI:     ASA  Norco  DME:   Pt already has equipment   PT: Rx given for  OPPT     Patient Active Problem List   Diagnosis Date Noted  . Primary osteoarthritis of left knee 01/01/2014  . Knee stiffness 01/01/2014  . Knee pain, left 01/01/2014  . Difficulty walking 01/01/2014  . S/P left TKA 12/08/2013   Past Medical History:  Diagnosis Date  . Anxiety   .  Arthritis   . Depression   . Dysrhythmia    palpitations  . Hypothyroidism     Past Surgical History:  Procedure Laterality Date  . CARDIAC CATHETERIZATION  2009  . TOTAL KNEE ARTHROPLASTY Left 12/08/2013   Procedure: LEFT TOTAL KNEE ARTHROPLASTY;  Surgeon: Shelda PalMatthew D Olin, MD;  Location: [facility];  Service: Orthopedics;  Laterality: Left;    No prescriptions prior to admission.   No Known Allergies   Social History  Substance Use Topics  . Smoking status: Former Smoker    Packs/day: 1.00    Years: 35.00    Types: Cigarettes  . Smokeless tobacco: Never Used  . Alcohol use No    Family History  Problem Relation Age of Onset  . Cancer Brother      Review of Systems  Constitutional: Negative.   HENT: Negative.   Eyes: Negative.   Respiratory: Negative.   Cardiovascular: Negative.   Gastrointestinal: Negative.   Genitourinary: Negative.   Musculoskeletal: Positive for joint pain.  Skin: Negative.   Neurological: Negative.   Endo/Heme/Allergies: Negative.   Psychiatric/Behavioral: Positive for depression. The patient is nervous/anxious.     Objective:  Physical Exam  Constitutional: She is oriented to person, place, and time. She appears well-developed.  HENT:  Head: Normocephalic.  Eyes: Pupils are equal, round, and reactive to light.  Neck: Neck supple. No  JVD present. No tracheal deviation present. No thyromegaly present.  Cardiovascular: Normal rate, regular rhythm and intact distal pulses.   Respiratory: Effort normal and breath sounds normal. No respiratory distress. She has no wheezes.  GI: Soft. There is no tenderness. There is no guarding.  Musculoskeletal:       Right knee: She exhibits decreased range of motion, swelling and bony tenderness. She exhibits no ecchymosis, no deformity, no laceration and no erythema. Tenderness found.  Lymphadenopathy:    She has no cervical adenopathy.  Neurological: She is alert and oriented to person, place, and time.   Skin: Skin is warm and dry.  Psychiatric: She has a normal mood and affect.      Labs:  Estimated body mass index is 28.19 kg/m as calculated from the following:   Height as of 12/18/15: 5\' 7"  (1.702 m).   Weight as of 12/18/15: 81.6 kg (180 lb).   Imaging Review Plain radiographs demonstrate severe degenerative joint disease of the right knee(s).  The bone quality appears to be good for age and reported activity level.  Assessment/Plan:  End stage arthritis, right knee   The patient history, physical examination, clinical judgment of the provider and imaging studies are consistent with end stage degenerative joint disease of the right knee(s) and total knee arthroplasty is deemed medically necessary. The treatment options including medical management, injection therapy arthroscopy and arthroplasty were discussed at length. The risks and benefits of total knee arthroplasty were presented and reviewed. The risks due to aseptic loosening, infection, stiffness, patella tracking problems, thromboembolic complications and other imponderables were discussed. The patient acknowledged the explanation, agreed to proceed with the plan and consent was signed. Patient is being admitted for inpatient treatment for surgery, pain control, PT, OT, prophylactic antibiotics, VTE prophylaxis, progressive ambulation and ADL's and discharge planning. The patient is planning to be discharged home.      Anastasio Auerbach Teresa Theilen   PA-C  08/22/2016, 10:21 PM

## 2016-08-22 NOTE — Patient Instructions (Addendum)
Teresa Charles  08/22/2016   Your procedure is scheduled on: 08/28/16  [facility]                 Follow signs to Short Stay on first floor at    0515AM  Call this number if you have problems the morning of surgery  [phone]   Remember: ONLY 1 PERSON MAY GO WITH YOU TO SHORT STAY TO GET  READY MORNING OF YOUR SURGERY.  Do not eat food or drink liquids :After Midnight.     Take these medicines the morning of surgery with A SIP OF WATER: METOPROLOL, SYNTHROID, WELLBUTRIN                                You may not have any metal on your body including hair pins and              piercings  Do not wear jewelry, make-up, lotions, powders or perfumes, deodorant             Do not wear nail polish.  Do not shave  48 hours prior to surgery.               Do not bring valuables to the hospital. [facility] IS NOT             RESPONSIBLE   FOR VALUABLES.  Contacts, dentures or bridgework may not be worn into surgery.  Leave suitcase in the car. After surgery it may be brought to your room.     Patients discharged the day of surgery will not be allowed to drive home.  Name and phone number of your driver:  Special Instructions: N/A              Please read over the following fact sheets you were given: _____________________________________________________________________             [facility] - Preparing for [facility] Before surgery, you can play an important role.  Because skin is not sterile, your skin needs to be as free of germs as possible.  You can reduce the number of germs on your skin by washing with CHG (chlorahexidine gluconate) soap before surgery.  CHG is an antiseptic cleaner which kills germs and bonds with the skin to continue killing germs even after washing. Please DO NOT use if you have an allergy to CHG or antibacterial soaps.  If your skin becomes reddened/irritated stop using the CHG and inform your nurse when you  arrive at Short Stay. Do not shave (including legs and underarms) for at least 48 hours prior to the first CHG shower.  You may shave your face/neck. Please follow these instructions carefully:  1.  Shower with CHG Soap the night before surgery and the  morning of Surgery.  2.  If you choose to wash your hair, wash your hair first as usual with your  normal  shampoo.  3.  After you shampoo, rinse your hair and body thoroughly to remove the  shampoo.                           4.  Use CHG as you would any other liquid soap.  You can apply chg directly  to the skin and wash  Gently with a scrungie or clean washcloth.  5.  Apply the CHG Soap to your body ONLY FROM THE NECK DOWN.   Do not use on face/ open                           Wound or open sores. Avoid contact with eyes, ears mouth and genitals (private parts).                       Wash face,  Genitals (private parts) with your normal soap.             6.  Wash thoroughly, paying special attention to the area where your surgery  will be performed.  7.  Thoroughly rinse your body with warm water from the neck down.  8.  DO NOT shower/wash with your normal soap after using and rinsing off  the CHG Soap.                9.  Pat yourself dry with a clean towel.            10.  Wear clean pajamas.            11.  Place clean sheets on your bed the night of your first shower and do not  sleep with pets. Day of Surgery : Do not apply any lotions/deodorants the morning of surgery.  Please wear clean clothes to the hospital/surgery center.  FAILURE TO FOLLOW THESE INSTRUCTIONS MAY RESULT IN THE CANCELLATION OF YOUR SURGERY PATIENT SIGNATURE_________________________________  NURSE SIGNATURE__________________________________  ________________________________________________________________________  WHAT IS A BLOOD TRANSFUSION? Blood Transfusion Information  A transfusion is the replacement of blood or some of its parts. Blood  is made up of multiple cells which provide different functions.  Red blood cells carry oxygen and are used for blood loss replacement.  White blood cells fight against infection.  Platelets control bleeding.  Plasma helps clot blood.  Other blood products are available for specialized needs, such as hemophilia or other clotting disorders. BEFORE THE TRANSFUSION  Who gives blood for transfusions?   Healthy volunteers who are fully evaluated to make sure their blood is safe. This is blood bank blood. Transfusion therapy is the safest it has ever been in the practice of medicine. Before blood is taken from a donor, a complete history is taken to make sure that person has no history of diseases nor engages in risky social behavior (examples are intravenous drug use or sexual activity with multiple partners). The donor's travel history is screened to minimize risk of transmitting infections, such as malaria. The donated blood is tested for signs of infectious diseases, such as HIV and hepatitis. The blood is then tested to be sure it is compatible with you in order to minimize the chance of a transfusion reaction. If you or a relative donates blood, this is often done in anticipation of surgery and is not appropriate for emergency situations. It takes many days to process the donated blood. RISKS AND COMPLICATIONS Although transfusion therapy is very safe and saves many lives, the main dangers of transfusion include:   Getting an infectious disease.  Developing a transfusion reaction. This is an allergic reaction to something in the blood you were given. Every precaution is taken to prevent this. The decision to have a blood transfusion has been considered carefully by your caregiver before blood is given. Blood is not given unless the benefits outweigh  the risks. AFTER THE TRANSFUSION  Right after receiving a blood transfusion, you will usually feel much better and more energetic. This is  especially true if your red blood cells have gotten low (anemic). The transfusion raises the level of the red blood cells which carry oxygen, and this usually causes an energy increase.  The nurse administering the transfusion will monitor you carefully for complications. HOME CARE INSTRUCTIONS  No special instructions are needed after a transfusion. You may find your energy is better. Speak with your caregiver about any limitations on activity for underlying diseases you may have. SEEK MEDICAL CARE IF:   Your condition is not improving after your transfusion.  You develop redness or irritation at the intravenous (IV) site. SEEK IMMEDIATE MEDICAL CARE IF:  Any of the following symptoms occur over the next 12 hours:  Shaking chills.  You have a temperature by mouth above 102 F (38.9 C), not controlled by medicine.  Chest, back, or muscle pain.  People around you feel you are not acting correctly or are confused.  Shortness of breath or difficulty breathing.  Dizziness and fainting.  You get a rash or develop hives.  You have a decrease in urine output.  Your urine turns a dark color or changes to pink, red, or brown. Any of the following symptoms occur over the next 10 days:  You have a temperature by mouth above 102 F (38.9 C), not controlled by medicine.  Shortness of breath.  Weakness after normal activity.  The white part of the eye turns yellow (jaundice).  You have a decrease in the amount of urine or are urinating less often.  Your urine turns a dark color or changes to pink, red, or brown. Document Released: 02/11/2000 Document Revised: 05/08/2011 Document Reviewed: 09/30/2007 ExitCare Patient Information 2014 [place].  _______________________________________________________________________  Incentive Spirometer  An incentive spirometer is a tool that can help keep your lungs clear and active. This tool measures how well you are filling your lungs  with each breath. Taking long deep breaths may help reverse or decrease the chance of developing breathing (pulmonary) problems (especially infection) following:  A long period of time when you are unable to move or be active. BEFORE THE PROCEDURE   If the spirometer includes an indicator to show your best effort, your nurse or respiratory therapist will set it to a desired goal.  If possible, sit up straight or lean slightly forward. Try not to slouch.  Hold the incentive spirometer in an upright position. INSTRUCTIONS FOR USE  1. Sit on the edge of your bed if possible, or sit up as far as you can in bed or on a chair. 2. Hold the incentive spirometer in an upright position. 3. Breathe out normally. 4. Place the mouthpiece in your mouth and seal your lips tightly around it. 5. Breathe in slowly and as deeply as possible, raising the piston or the ball toward the top of the column. 6. Hold your breath for 3-5 seconds or for as long as possible. Allow the piston or ball to fall to the bottom of the column. 7. Remove the mouthpiece from your mouth and breathe out normally. 8. Rest for a few seconds and repeat Steps 1 through 7 at least 10 times every 1-2 hours when you are awake. Take your time and take a few normal breaths between deep breaths. 9. The spirometer may include an indicator to show your best effort. Use the indicator as a goal to work  toward during each repetition. 10. After each set of 10 deep breaths, practice coughing to be sure your lungs are clear. If you have an incision (the cut made at the time of surgery), support your incision when coughing by placing a pillow or rolled up towels firmly against it. Once you are able to get out of bed, walk around indoors and cough well. You may stop using the incentive spirometer when instructed by your caregiver.  RISKS AND COMPLICATIONS  Take your time so you do not get dizzy or light-headed.  If you are in pain, you may need to  take or ask for pain medication before doing incentive spirometry. It is harder to take a deep breath if you are having pain. AFTER USE  Rest and breathe slowly and easily.  It can be helpful to keep track of a log of your progress. Your caregiver can provide you with a simple table to help with this. If you are using the spirometer at home, follow these instructions: [place] IF:   You are having difficultly using the spirometer.  You have trouble using the spirometer as often as instructed.  Your pain medication is not giving enough relief while using the spirometer.  You develop fever of 100.5 F (38.1 C) or higher. SEEK IMMEDIATE MEDICAL CARE IF:   You cough up bloody sputum that had not been present before.  You develop fever of 102 F (38.9 C) or greater.  You develop worsening pain at or near the incision site. MAKE SURE YOU:   Understand these instructions.  Will watch your condition.  Will get help right away if you are not doing well or get worse. Document Released: 06/26/2006 Document Revised: 05/08/2011 Document Reviewed: 08/27/2006 [facility] Patient Information 2014 [place], [place].   ________________________________________________________________________

## 2016-08-22 NOTE — Telephone Encounter (Signed)
08/22/16 Dr. Nilsa Nuttinglin's office called and said it was fine for patient to start therapy on 7/3.  I called and left patient a message to let her know.

## 2016-08-24 ENCOUNTER — Encounter (HOSPITAL_COMMUNITY)
Admission: RE | Admit: 2016-08-24 | Discharge: 2016-08-24 | Disposition: A | Discharge status: Home / Self Care | Payer: No Typology Code available for payment source | Source: Ambulatory Visit | Attending: Orthopedic Surgery | Admitting: Orthopedic Surgery

## 2016-08-24 ENCOUNTER — Encounter (HOSPITAL_COMMUNITY): Payer: Self-pay

## 2016-08-24 DIAGNOSIS — Z01810 Encounter for preprocedural cardiovascular examination: Secondary | ICD-10-CM | POA: Insufficient documentation

## 2016-08-24 DIAGNOSIS — Z01818 Encounter for other preprocedural examination: Secondary | ICD-10-CM | POA: Insufficient documentation

## 2016-08-24 DIAGNOSIS — M1711 Unilateral primary osteoarthritis, right knee: Secondary | ICD-10-CM | POA: Insufficient documentation

## 2016-08-24 LAB — CBC
HEMATOCRIT: 42.8 % (ref 36.0–46.0)
Hemoglobin: 14.3 g/dL (ref 12.0–15.0)
MCH: 29.4 pg (ref 26.0–34.0)
MCHC: 33.4 g/dL (ref 30.0–36.0)
MCV: 88.1 fL (ref 78.0–100.0)
PLATELETS: 232 10*3/uL (ref 150–400)
RBC: 4.86 MIL/uL (ref 3.87–5.11)
RDW: 13.1 % (ref 11.5–15.5)
WBC: 9.2 10*3/uL (ref 4.0–10.5)

## 2016-08-24 LAB — SURGICAL PCR SCREEN
MRSA, PCR: NEGATIVE
STAPHYLOCOCCUS AUREUS: POSITIVE — AB

## 2016-08-24 LAB — BASIC METABOLIC PANEL
Anion gap: 8 (ref 5–15)
BUN: 13 mg/dL (ref 6–20)
CALCIUM: 9.3 mg/dL (ref 8.9–10.3)
CO2: 25 mmol/L (ref 22–32)
Chloride: 107 mmol/L (ref 101–111)
Creatinine, Ser: 0.7 mg/dL (ref 0.44–1.00)
GFR calc Af Amer: >60 mL/min (ref >60)
GLUCOSE: 84 mg/dL (ref 65–99)
POTASSIUM: 3.9 mmol/L (ref 3.5–5.1)
SODIUM: 140 mmol/L (ref 135–145)

## 2016-08-25 NOTE — Progress Notes (Signed)
Final ekg 08/25/16 in epic

## 2016-08-27 NOTE — Anesthesia Preprocedure Evaluation (Addendum)
Anesthesia Evaluation  Patient identified by MRN, date of birth, ID band Patient awake    Reviewed: Allergy & Precautions, NPO status , Patient's Chart, lab work & pertinent test results  Airway Mallampati: II  TM Distance: >3 FB     Dental   Pulmonary former smoker,    breath sounds clear to auscultation       Cardiovascular + dysrhythmias  Rhythm:Regular Rate:Normal     Neuro/Psych    GI/Hepatic negative GI ROS, Neg liver ROS,   Endo/Other  Hypothyroidism   Renal/GU negative Renal ROS     Musculoskeletal  (+) Arthritis ,   Abdominal   Peds  Hematology   Anesthesia Other Findings   Reproductive/Obstetrics                            Anesthesia Physical Anesthesia Plan  ASA: III  Anesthesia Plan: Spinal   Post-op Pain Management:  Regional for Post-op pain   Induction: Intravenous  PONV Risk Score and Plan: 2 and Ondansetron, Dexamethasone, Propofol, Midazolam and Treatment may vary due to age or medical condition  Airway Management Planned: Simple Face Mask  Additional Equipment:   Intra-op Plan:   Post-operative Plan:   Informed Consent: I have reviewed the patients History and Physical, chart, labs and discussed the procedure including the risks, benefits and alternatives for the proposed anesthesia with the patient or authorized representative who has indicated his/her understanding and acceptance.   Dental advisory given  Plan Discussed with: Anesthesiologist, CRNA and Surgeon  Anesthesia Plan Comments:        Anesthesia Quick Evaluation

## 2016-08-28 ENCOUNTER — Ambulatory Visit (HOSPITAL_COMMUNITY): Payer: No Typology Code available for payment source | Admitting: Anesthesiology

## 2016-08-28 ENCOUNTER — Ambulatory Visit (HOSPITAL_COMMUNITY)
Admission: RE | Admit: 2016-08-28 | Discharge: 2016-08-28 | Disposition: A | Discharge status: Home / Self Care | Payer: No Typology Code available for payment source | Source: Ambulatory Visit | Attending: Orthopedic Surgery | Admitting: Orthopedic Surgery

## 2016-08-28 ENCOUNTER — Encounter (HOSPITAL_COMMUNITY): Payer: Self-pay

## 2016-08-28 ENCOUNTER — Encounter (HOSPITAL_COMMUNITY)
Admission: RE | Disposition: A | Discharge status: Home / Self Care | Payer: Self-pay | Source: Ambulatory Visit | Attending: Orthopedic Surgery

## 2016-08-28 DIAGNOSIS — Z87891 Personal history of nicotine dependence: Secondary | ICD-10-CM | POA: Diagnosis not present

## 2016-08-28 DIAGNOSIS — Z96652 Presence of left artificial knee joint: Secondary | ICD-10-CM | POA: Diagnosis not present

## 2016-08-28 DIAGNOSIS — F329 Major depressive disorder, single episode, unspecified: Secondary | ICD-10-CM | POA: Diagnosis not present

## 2016-08-28 DIAGNOSIS — M25761 Osteophyte, right knee: Secondary | ICD-10-CM | POA: Insufficient documentation

## 2016-08-28 DIAGNOSIS — M65861 Other synovitis and tenosynovitis, right lower leg: Secondary | ICD-10-CM | POA: Diagnosis not present

## 2016-08-28 DIAGNOSIS — Z79899 Other long term (current) drug therapy: Secondary | ICD-10-CM | POA: Diagnosis not present

## 2016-08-28 DIAGNOSIS — M1711 Unilateral primary osteoarthritis, right knee: Secondary | ICD-10-CM | POA: Diagnosis present

## 2016-08-28 DIAGNOSIS — F419 Anxiety disorder, unspecified: Secondary | ICD-10-CM | POA: Diagnosis not present

## 2016-08-28 DIAGNOSIS — E039 Hypothyroidism, unspecified: Secondary | ICD-10-CM | POA: Diagnosis not present

## 2016-08-28 DIAGNOSIS — Z96651 Presence of right artificial knee joint: Secondary | ICD-10-CM

## 2016-08-28 DIAGNOSIS — M25461 Effusion, right knee: Secondary | ICD-10-CM | POA: Insufficient documentation

## 2016-08-28 HISTORY — PX: TOTAL KNEE ARTHROPLASTY: SHX125

## 2016-08-28 LAB — TYPE AND SCREEN
ABO/RH(D): O NEG
Antibody Screen: NEGATIVE

## 2016-08-28 SURGERY — ARTHROPLASTY, KNEE, TOTAL
Anesthesia: Spinal | Site: Knee | Laterality: Right

## 2016-08-28 MED ORDER — SUCCINYLCHOLINE CHLORIDE 200 MG/10ML IV SOSY
PREFILLED_SYRINGE | INTRAVENOUS | Status: AC
  Filled 2016-08-28: qty 10

## 2016-08-28 MED ORDER — PROPOFOL 500 MG/50ML IV EMUL
INTRAVENOUS | Status: DC | PRN
  Administered 2016-08-28: 100 ug/kg/min via INTRAVENOUS

## 2016-08-28 MED ORDER — MIDAZOLAM HCL 2 MG/2ML IJ SOLN
INTRAMUSCULAR | Status: AC
  Filled 2016-08-28: qty 2

## 2016-08-28 MED ORDER — ONDANSETRON HCL 4 MG/2ML IJ SOLN
INTRAMUSCULAR | Status: DC | PRN
  Administered 2016-08-28: 4 mg via INTRAVENOUS

## 2016-08-28 MED ORDER — SODIUM CHLORIDE 0.9 % IV BOLUS (SEPSIS)
500.0000 mL | Freq: Once | INTRAVENOUS | Status: AC | PRN
  Administered 2016-08-28: 500 mL via INTRAVENOUS

## 2016-08-28 MED ORDER — SODIUM CHLORIDE 0.9 % IV BOLUS (SEPSIS): 500.0000 mL | Freq: Once | INTRAVENOUS | Status: DC

## 2016-08-28 MED ORDER — KETOROLAC TROMETHAMINE 30 MG/ML IJ SOLN
INTRAMUSCULAR | Status: AC
  Filled 2016-08-28: qty 1

## 2016-08-28 MED ORDER — OXYCODONE HCL 5 MG PO TABS: 5.0000 mg | ORAL_TABLET | ORAL | 0 refills | Status: DC | PRN

## 2016-08-28 MED ORDER — PROPOFOL 10 MG/ML IV BOLUS
INTRAVENOUS | Status: AC
  Filled 2016-08-28: qty 60

## 2016-08-28 MED ORDER — OXYCODONE HCL 5 MG PO TABS
10.0000 mg | ORAL_TABLET | ORAL | Status: DC | PRN
  Administered 2016-08-28: 10 mg via ORAL
  Filled 2016-08-28: qty 2

## 2016-08-28 MED ORDER — MIDAZOLAM HCL 5 MG/ML IJ SOLN
1.0000 mg | INTRAMUSCULAR | Status: DC | PRN
  Administered 2016-08-28: 2 mg via INTRAVENOUS

## 2016-08-28 MED ORDER — CEFAZOLIN SODIUM-DEXTROSE 2-4 GM/100ML-% IV SOLN
2.0000 g | INTRAVENOUS | Status: AC
  Administered 2016-08-28: 2 g via INTRAVENOUS

## 2016-08-28 MED ORDER — POLYETHYLENE GLYCOL 3350 17 G PO PACK: 17.0000 g | PACK | Freq: Two times a day (BID) | ORAL | 0 refills | Status: DC

## 2016-08-28 MED ORDER — SODIUM CHLORIDE 0.9 % IV SOLN
INTRAVENOUS | Status: DC
  Administered 2016-08-28: 10:00:00 via INTRAVENOUS

## 2016-08-28 MED ORDER — DOCUSATE SODIUM 100 MG PO CAPS: 100.0000 mg | ORAL_CAPSULE | Freq: Two times a day (BID) | ORAL | 0 refills | Status: DC

## 2016-08-28 MED ORDER — BUPIVACAINE IN DEXTROSE 0.75-8.25 % IT SOLN
INTRATHECAL | Status: DC | PRN
  Administered 2016-08-28: 2 mL via INTRATHECAL

## 2016-08-28 MED ORDER — HYDROCODONE-ACETAMINOPHEN 7.5-325 MG PO TABS: 1.0000 | ORAL_TABLET | ORAL | 0 refills | Status: DC | PRN

## 2016-08-28 MED ORDER — BUPIVACAINE-EPINEPHRINE (PF) 0.25% -1:200000 IJ SOLN
INTRAMUSCULAR | Status: DC | PRN
Start: 2016-08-28 — End: 2016-08-28
  Administered 2016-08-28: 30 mL

## 2016-08-28 MED ORDER — FENTANYL CITRATE (PF) 100 MCG/2ML IJ SOLN
INTRAMUSCULAR | Status: AC
  Filled 2016-08-28: qty 2

## 2016-08-28 MED ORDER — PROMETHAZINE HCL 25 MG/ML IJ SOLN
6.2500 mg | INTRAMUSCULAR | Status: DC | PRN
  Administered 2016-08-28: 6.25 mg via INTRAVENOUS
  Filled 2016-08-28: qty 1

## 2016-08-28 MED ORDER — FENTANYL CITRATE (PF) 100 MCG/2ML IJ SOLN: 25.0000 ug | INTRAMUSCULAR | Status: DC | PRN

## 2016-08-28 MED ORDER — FENTANYL CITRATE (PF) 100 MCG/2ML IJ SOLN
50.0000 ug | INTRAMUSCULAR | Status: DC | PRN
  Administered 2016-08-28: 50 ug via INTRAVENOUS

## 2016-08-28 MED ORDER — FENTANYL CITRATE (PF) 100 MCG/2ML IJ SOLN
25.0000 ug | INTRAMUSCULAR | Status: DC | PRN
  Administered 2016-08-28 (×2): 50 ug via INTRAVENOUS

## 2016-08-28 MED ORDER — ACETAMINOPHEN 500 MG PO TABS: 1000.0000 mg | ORAL_TABLET | Freq: Three times a day (TID) | ORAL | 0 refills | Status: AC

## 2016-08-28 MED ORDER — METHOCARBAMOL 500 MG PO TABS: 500.0000 mg | ORAL_TABLET | Freq: Four times a day (QID) | ORAL | Status: DC | PRN

## 2016-08-28 MED ORDER — FENTANYL CITRATE (PF) 100 MCG/2ML IJ SOLN
INTRAMUSCULAR | Status: AC
  Administered 2016-08-28: 50 ug via INTRAVENOUS
  Filled 2016-08-28: qty 2

## 2016-08-28 MED ORDER — SODIUM CHLORIDE 0.9 % IJ SOLN
INTRAMUSCULAR | Status: DC | PRN
  Administered 2016-08-28: 30 mL

## 2016-08-28 MED ORDER — CEPHALEXIN 500 MG PO CAPS: 500.0000 mg | ORAL_CAPSULE | Freq: Three times a day (TID) | ORAL | 0 refills | Status: DC

## 2016-08-28 MED ORDER — PROPOFOL 10 MG/ML IV BOLUS
INTRAVENOUS | Status: DC | PRN
Start: 2016-08-28 — End: 2016-08-28
  Administered 2016-08-28: 20 mg via INTRAVENOUS

## 2016-08-28 MED ORDER — BUPIVACAINE-EPINEPHRINE (PF) 0.25% -1:200000 IJ SOLN
INTRAMUSCULAR | Status: AC
  Filled 2016-08-28: qty 30

## 2016-08-28 MED ORDER — SODIUM CHLORIDE 0.9 % IR SOLN
Status: DC | PRN
  Administered 2016-08-28: 1000 mL

## 2016-08-28 MED ORDER — METHOCARBAMOL 1000 MG/10ML IJ SOLN
500.0000 mg | Freq: Four times a day (QID) | INTRAVENOUS | Status: DC | PRN
  Administered 2016-08-28: 500 mg via INTRAVENOUS
  Filled 2016-08-28: qty 5

## 2016-08-28 MED ORDER — DEXAMETHASONE SODIUM PHOSPHATE 10 MG/ML IJ SOLN
INTRAMUSCULAR | Status: AC
  Filled 2016-08-28: qty 1

## 2016-08-28 MED ORDER — FENTANYL CITRATE (PF) 100 MCG/2ML IJ SOLN
INTRAMUSCULAR | Status: DC | PRN
  Administered 2016-08-28: 50 ug via INTRAVENOUS

## 2016-08-28 MED ORDER — CEFAZOLIN SODIUM-DEXTROSE 2-4 GM/100ML-% IV SOLN
INTRAVENOUS | Status: AC
  Filled 2016-08-28: qty 100

## 2016-08-28 MED ORDER — LIDOCAINE 2% (20 MG/ML) 5 ML SYRINGE
INTRAMUSCULAR | Status: AC
  Filled 2016-08-28: qty 5

## 2016-08-28 MED ORDER — PHENYLEPHRINE 40 MCG/ML (10ML) SYRINGE FOR IV PUSH (FOR BLOOD PRESSURE SUPPORT)
PREFILLED_SYRINGE | INTRAVENOUS | Status: DC | PRN
  Administered 2016-08-28: 80 ug via INTRAVENOUS
  Administered 2016-08-28: 40 ug via INTRAVENOUS

## 2016-08-28 MED ORDER — ONDANSETRON HCL 4 MG/2ML IJ SOLN
INTRAMUSCULAR | Status: AC
  Filled 2016-08-28: qty 2

## 2016-08-28 MED ORDER — TRANEXAMIC ACID 1000 MG/10ML IV SOLN
1000.0000 mg | Freq: Once | INTRAVENOUS | Status: AC
  Administered 2016-08-28: 1000 mg via INTRAVENOUS
  Filled 2016-08-28: qty 1100
  Filled 2016-08-28: qty 10

## 2016-08-28 MED ORDER — FERROUS SULFATE 325 (65 FE) MG PO TABS: 325.0000 mg | ORAL_TABLET | Freq: Three times a day (TID) | ORAL | Status: DC

## 2016-08-28 MED ORDER — DEXAMETHASONE SODIUM PHOSPHATE 10 MG/ML IJ SOLN
10.0000 mg | Freq: Once | INTRAMUSCULAR | Status: AC
  Administered 2016-08-28: 10 mg via INTRAVENOUS

## 2016-08-28 MED ORDER — PHENYLEPHRINE 40 MCG/ML (10ML) SYRINGE FOR IV PUSH (FOR BLOOD PRESSURE SUPPORT)
PREFILLED_SYRINGE | INTRAVENOUS | Status: AC
  Filled 2016-08-28: qty 10

## 2016-08-28 MED ORDER — HYDROMORPHONE HCL 2 MG PO TABS: 2.0000 mg | ORAL_TABLET | Freq: Four times a day (QID) | ORAL | 0 refills | Status: DC | PRN

## 2016-08-28 MED ORDER — LACTATED RINGERS IV SOLN
INTRAVENOUS | Status: DC
  Administered 2016-08-28: 08:00:00 via INTRAVENOUS
  Administered 2016-08-28: 1000 mL via INTRAVENOUS

## 2016-08-28 MED ORDER — ASPIRIN 81 MG PO CHEW: 81.0000 mg | CHEWABLE_TABLET | Freq: Two times a day (BID) | ORAL | 0 refills | Status: DC

## 2016-08-28 MED ORDER — CHLORHEXIDINE GLUCONATE 4 % EX LIQD: 60.0000 mL | Freq: Once | CUTANEOUS | Status: DC

## 2016-08-28 MED ORDER — MIDAZOLAM HCL 5 MG/5ML IJ SOLN
INTRAMUSCULAR | Status: DC | PRN
  Administered 2016-08-28 (×2): 1 mg via INTRAVENOUS

## 2016-08-28 MED ORDER — LIDOCAINE 2% (20 MG/ML) 5 ML SYRINGE
INTRAMUSCULAR | Status: DC | PRN
  Administered 2016-08-28: 100 mg via INTRAVENOUS

## 2016-08-28 MED ORDER — TRANEXAMIC ACID 1000 MG/10ML IV SOLN
1000.0000 mg | INTRAVENOUS | Status: AC
  Administered 2016-08-28: 1000 mg via INTRAVENOUS
  Filled 2016-08-28: qty 1100

## 2016-08-28 MED ORDER — KETOROLAC TROMETHAMINE 30 MG/ML IJ SOLN
INTRAMUSCULAR | Status: DC | PRN
  Administered 2016-08-28: 30 mg

## 2016-08-28 MED ORDER — METHOCARBAMOL 500 MG PO TABS: 500.0000 mg | ORAL_TABLET | Freq: Four times a day (QID) | ORAL | 0 refills | Status: AC | PRN

## 2016-08-28 MED ORDER — SODIUM CHLORIDE 0.9 % IJ SOLN
INTRAMUSCULAR | Status: AC
  Filled 2016-08-28: qty 50

## 2016-08-28 MED FILL — oxyCODONE HCL 5 MG TABS: 5 | 10 days supply | Qty: 90 | Fill #0

## 2016-08-28 MED FILL — CEPHALEXIN 500 MG CAPSULE: 500 | 5 days supply | Qty: 15 | Fill #0

## 2016-08-28 MED FILL — METHOCARBAMOL 500 MG TABLET: 500 | 10 days supply | Qty: 40 | Fill #0

## 2016-08-28 SURGICAL SUPPLY — 47 items
BAG DECANTER FOR FLEXI CONT (MISCELLANEOUS) IMPLANT
BAG ZIPLOCK 12X15 (MISCELLANEOUS) IMPLANT
BANDAGE ACE 6X5 VEL STRL LF (GAUZE/BANDAGES/DRESSINGS) ×2 IMPLANT
BLADE SAW SGTL 11.0X1.19X90.0M (BLADE) ×2 IMPLANT
BLADE SAW SGTL 13.0X1.19X90.0M (BLADE) ×2 IMPLANT
BOWL SMART MIX CTS (DISPOSABLE) ×2 IMPLANT
CAPT KNEE TOTAL 3 ATTUNE ×2 IMPLANT
CEMENT HV SMART SET (Cement) ×4 IMPLANT
COVER SURGICAL LIGHT HANDLE (MISCELLANEOUS) ×2 IMPLANT
CUFF TOURN SGL QUICK 34 (TOURNIQUET CUFF) ×1
CUFF TRNQT CYL 34X4X40X1 (TOURNIQUET CUFF) ×1 IMPLANT
DECANTER SPIKE VIAL GLASS SM (MISCELLANEOUS) IMPLANT
DERMABOND ADVANCED (GAUZE/BANDAGES/DRESSINGS) ×1
DERMABOND ADVANCED .7 DNX12 (GAUZE/BANDAGES/DRESSINGS) ×1 IMPLANT
DRAPE U-SHAPE 47X51 STRL (DRAPES) ×2 IMPLANT
DRESSING AQUACEL AG SP 3.5X10 (GAUZE/BANDAGES/DRESSINGS) ×1 IMPLANT
DRSG AQUACEL AG SP 3.5X10 (GAUZE/BANDAGES/DRESSINGS) ×2
DURAPREP 26ML APPLICATOR (WOUND CARE) ×4 IMPLANT
ELECT REM PT RETURN 15FT ADLT (MISCELLANEOUS) ×2 IMPLANT
GLOVE BIOGEL M 7.0 STRL (GLOVE) IMPLANT
GLOVE BIOGEL PI IND STRL 6.5 (GLOVE) ×1 IMPLANT
GLOVE BIOGEL PI IND STRL 7.5 (GLOVE) ×1 IMPLANT
GLOVE BIOGEL PI IND STRL 8.5 (GLOVE) ×2 IMPLANT
GLOVE BIOGEL PI INDICATOR 6.5 (GLOVE) ×1
GLOVE BIOGEL PI INDICATOR 7.5 (GLOVE) ×1
GLOVE BIOGEL PI INDICATOR 8.5 (GLOVE) ×2
GLOVE ECLIPSE 8.0 STRL XLNG CF (GLOVE) ×2 IMPLANT
GLOVE ORTHO TXT STRL SZ7.5 (GLOVE) ×4 IMPLANT
GOWN STRL REUS W/TWL LRG LVL3 (GOWN DISPOSABLE) ×2 IMPLANT
GOWN STRL REUS W/TWL XL LVL3 (GOWN DISPOSABLE) ×2 IMPLANT
HANDPIECE INTERPULSE COAX TIP (DISPOSABLE) ×1
MANIFOLD NEPTUNE II (INSTRUMENTS) ×2 IMPLANT
PACK TOTAL KNEE CUSTOM (KITS) ×2 IMPLANT
POSITIONER SURGICAL ARM (MISCELLANEOUS) ×2 IMPLANT
SET HNDPC FAN SPRY TIP SCT (DISPOSABLE) ×1 IMPLANT
SET PAD KNEE POSITIONER (MISCELLANEOUS) ×2 IMPLANT
SUT MNCRL AB 4-0 PS2 18 (SUTURE) ×2 IMPLANT
SUT STRATAFIX 0 PDS 27 VIOLET (SUTURE) ×2
SUT VIC AB 1 CT1 36 (SUTURE) ×2 IMPLANT
SUT VIC AB 2-0 CT1 27 (SUTURE) ×3
SUT VIC AB 2-0 CT1 TAPERPNT 27 (SUTURE) ×3 IMPLANT
SUTURE STRATFX 0 PDS 27 VIOLET (SUTURE) ×1 IMPLANT
SYR 50ML LL SCALE MARK (SYRINGE) IMPLANT
TRAY FOLEY W/METER SILVER 16FR (SET/KITS/TRAYS/PACK) ×2 IMPLANT
WATER STERILE IRR 1500ML POUR (IV SOLUTION) ×2 IMPLANT
WRAP KNEE MAXI GEL POST OP (GAUZE/BANDAGES/DRESSINGS) ×2 IMPLANT
YANKAUER SUCT BULB TIP 10FT TU (MISCELLANEOUS) ×2 IMPLANT

## 2016-08-28 NOTE — Progress Notes (Signed)
AssistedDr. Edwards with right, ultrasound guided, adductor canal block. Side rails up, monitors on throughout procedure. See vital signs in flow sheet. Tolerated Procedure well.  

## 2016-08-28 NOTE — Interval H&P Note (Signed)
History and Physical Interval Note:  08/28/2016 6:58 AM  Teresa Charles  has presented today for surgery, with the diagnosis of Right knee osteoarthritis  The various methods of treatment have been discussed with the patient and family. After consideration of risks, benefits and other options for treatment, the patient has consented to  Procedure(s): RIGHT TOTAL KNEE ARTHROPLASTY (Right) as a surgical intervention .  The patient's history has been reviewed, patient examined, no change in status, stable for surgery.  I have reviewed the patient's chart and labs.  Questions were answered to the patient's satisfaction.     Shelda PalLIN,Jerriyah Louis D

## 2016-08-28 NOTE — Discharge Instructions (Addendum)
Spinal Anesthesia and Epidural Anesthesia, Care After These instructions give you information about caring for yourself after your procedure. Your doctor may also give you more specific instructions. Call your doctor if you have any problems or questions after your procedure. Follow these instructions at home: For at least 24 hours after the procedure:   Do not: ? Do activities where you could fall or get hurt (injured). ? Drive. ? Use heavy machinery. ? Drink alcohol. ? Take sleeping pills or medicines that make you sleepy (drowsy). ? Make important decisions. ? Sign legal documents. ? Take care of children on your own.  Rest. Eating and drinking  If you throw up (vomit), drink water, juice, or soup when you can drink without throwing up.  Make sure you do not feel like throwing up (are not nauseous) before you eat.  Follow the diet that is recommended by your doctor. General instructions  Have a responsible adult stay with you until you are awake and alert.  Take over-the-counter and prescription medicines only as told by your doctor.  If you smoke, do not smoke unless an adult is watching.  Keep all follow-up visits as told by your doctor. This is important. Contact a doctor if:  It has been more than one day since your procedure and you feel like throwing up.  It has been more than one day since your procedure and you throw up.  You have a rash. Get help right away if:  You have a fever.  You have a headache that lasts a long time.  You have a very bad headache.  Your vision is blurry.  You see two of a single object (double vision).  You are dizzy or light-headed.  You faint.  Your arms or legs tingle, feel weak, or get numb.  You have trouble breathing.  You cannot pee (urinate). This information is not intended to replace advice given to you by your health care provider. Make sure you discuss any questions you have with your health care  provider. Document Released: 06/07/2015 Document Revised: 10/07/2015 Document Reviewed: 06/07/2015 Elsevier Interactive Patient Education  2018 [employer]     INSTRUCTIONS AFTER JOINT REPLACEMENT   o Remove items at home which could result in a fall. This includes throw rugs or furniture in walking pathways o ICE to the affected joint every three hours while awake for 30 minutes at a time, for at least the first 3-5 days, and then as needed for pain and swelling.  Continue to use ice for pain and swelling. You may notice swelling that will progress down to the foot and ankle.  This is normal after surgery.  Elevate your leg when you are not up walking on it.   o Continue to use the breathing machine you got in the hospital (incentive spirometer) which will help keep your temperature down.  It is common for your temperature to cycle up and down following surgery, especially at night when you are not up moving around and exerting yourself.  The breathing machine keeps your lungs expanded and your temperature down.   DIET:  As you were doing prior to hospitalization, we recommend a well-balanced diet.  DRESSING / WOUND CARE / SHOWERING  Keep the surgical dressing until follow up.  The dressing is water proof, so you can shower without any extra covering.  IF THE DRESSING FALLS OFF or the wound gets wet inside, change the dressing with sterile gauze.  Please use good hand washing  techniques before changing the dressing.  Do not use any lotions or creams on the incision until instructed by your surgeon.    ACTIVITY  o Increase activity slowly as tolerated, but follow the weight bearing instructions below.   o No driving for 6 weeks or until further direction given by your physician.  You cannot drive while taking narcotics.  o No lifting or carrying greater than 10 lbs. until further directed by your surgeon. o Avoid periods of inactivity such as sitting longer than an hour when not asleep.  This helps prevent blood clots.  o You may return to work once you are authorized by your doctor.     WEIGHT BEARING   Weight bearing as tolerated with assist device (walker, cane, etc) as directed, use it as long as suggested by your surgeon or therapist, typically at least 4-6 weeks.   EXERCISES  Results after joint replacement surgery are often greatly improved when you follow the exercise, range of motion and muscle strengthening exercises prescribed by your doctor. Safety measures are also important to protect the joint from further injury. Any time any of these exercises cause you to have increased pain or swelling, decrease what you are doing until you are comfortable again and then slowly increase them. If you have problems or questions, call your caregiver or physical therapist for advice.   Rehabilitation is important following a joint replacement. After just a few days of immobilization, the muscles of the leg can become weakened and shrink (atrophy).  These exercises are designed to build up the tone and strength of the thigh and leg muscles and to improve motion. Often times heat used for twenty to thirty minutes before working out will loosen up your tissues and help with improving the range of motion but do not use heat for the first two weeks following surgery (sometimes heat can increase post-operative swelling).   These exercises can be done on a training (exercise) mat, on the floor, on a table or on a bed. Use whatever works the best and is most comfortable for you.    Use music or television while you are exercising so that the exercises are a pleasant break in your day. This will make your life better with the exercises acting as a break in your routine that you can look forward to.   Perform all exercises about fifteen times, three times per day or as directed.  You should exercise both the operative leg and the other leg as well.  Exercises include:    Quad Sets - Tighten  up the muscle on the front of the thigh (Quad) and hold for 5-10 seconds.    Straight Leg Raises - With your knee straight (if you were given a brace, keep it on), lift the leg to 60 degrees, hold for 3 seconds, and slowly lower the leg.  Perform this exercise against resistance later as your leg gets stronger.   Leg Slides: Lying on your back, slowly slide your foot toward your buttocks, bending your knee up off the floor (only go as far as is comfortable). Then slowly slide your foot back down until your leg is flat on the floor again.   Angel Wings: Lying on your back spread your legs to the side as far apart as you can without causing discomfort.   Hamstring Strength:  Lying on your back, push your heel against the floor with your leg straight by tightening up the muscles of your buttocks.  Repeat, but this time bend your knee to a comfortable angle, and push your heel against the floor.  You may put a pillow under the heel to make it more comfortable if necessary.   A rehabilitation program following joint replacement surgery can speed recovery and prevent re-injury in the future due to weakened muscles. Contact your doctor or a physical therapist for more information on knee rehabilitation.    CONSTIPATION  Constipation is defined medically as fewer than three stools per week and severe constipation as less than one stool per week.  Even if you have a regular bowel pattern at home, your normal regimen is likely to be disrupted due to multiple reasons following surgery.  Combination of anesthesia, postoperative narcotics, change in appetite and fluid intake all can affect your bowels.   YOU MUST use at least one of the following options; they are listed in order of increasing strength to get the job done.  They are all available over the counter, and you may need to use some, POSSIBLY even all of these options:    Drink plenty of fluids (prune juice may be helpful) and high fiber  foods Colace 100 mg by mouth twice a day  Senokot for constipation as directed and as needed Dulcolax (bisacodyl), take with full glass of water  Miralax (polyethylene glycol) once or twice a day as needed.  If you have tried all these things and are unable to have a bowel movement in the first 3-4 days after surgery call either your surgeon or your primary doctor.    If you experience loose stools or diarrhea, hold the medications until you stool forms back up.  If your symptoms do not get better within 1 week or if they get worse, check with your doctor.  If you experience "the worst abdominal pain ever" or develop nausea or vomiting, please contact the office immediately for further recommendations for treatment.   ITCHING:  If you experience itching with your medications, try taking only a single pain pill, or even half a pain pill at a time.  You can also use Benadryl over the counter for itching or also to help with sleep.   TED HOSE STOCKINGS:  Use stockings on both legs until for at least 2 weeks or as directed by physician office. They may be removed at night for sleeping.  MEDICATIONS:  See your medication summary on the After Visit Summary that nursing will review with you.  You may have some home medications which will be placed on hold until you complete the course of blood thinner medication.  It is important for you to complete the blood thinner medication as prescribed.  PRECAUTIONS:  If you experience chest pain or shortness of breath - call 911 immediately for transfer to the hospital emergency department.   If you develop a fever greater that 101 F, purulent drainage from wound, increased redness or drainage from wound, foul odor from the wound/dressing, or calf pain - CONTACT YOUR SURGEON.                                                   FOLLOW-UP APPOINTMENTS:  If you do not already have a post-op appointment, please call the office for an appointment to be seen by your  surgeon.  Guidelines for how soon to be seen are  listed in your After Visit Summary, but are typically between 1-4 weeks after surgery.  OTHER INSTRUCTIONS:   Knee Replacement:  Do not place pillow under knee, focus on keeping the knee straight while resting.  MAKE SURE YOU:   Understand these instructions.   Get help right away if you are not doing well or get worse.    Thank you for letting us be a part of your medical care team.  It is a privilege we respect greatly.  We hope these instructions will help you stay on track for a fast and full recovery!

## 2016-08-28 NOTE — Anesthesia Procedure Notes (Signed)
Anesthesia Regional Block: Adductor canal block   Pre-Anesthetic Checklist: ,, timeout performed, Correct Patient, Correct Site, Correct Laterality, Correct Procedure, Correct Position, site marked, Risks and benefits discussed,  Surgical consent,  Pre-op evaluation,  At surgeon's request and post-op pain management  Laterality: Right  Prep: chloraprep       Needles:   Needle Type: Stimulator Needle - 80          Additional Needles:   Procedures: ultrasound guided,,,,,,,,  Narrative:  Start time: 08/28/2016 6:45 AM End time: 08/28/2016 7:00 AM Injection made incrementally with aspirations every 5 mL.  Performed by: Personally  Anesthesiologist: Dorris SinghGREEN, Teresa Charles

## 2016-08-28 NOTE — Anesthesia Procedure Notes (Signed)
Spinal  Patient location during procedure: OR Start time: 08/28/2016 7:19 AM End time: 08/28/2016 7:24 AM Staffing Resident/CRNA: Danley Danker L Performed: resident/CRNA  Preanesthetic Checklist Completed: patient identified, site marked, surgical consent, pre-op evaluation, timeout performed, IV checked, risks and benefits discussed and monitors and equipment checked Spinal Block Patient position: sitting Prep: Betadine Patient monitoring: heart rate, continuous pulse ox and blood pressure Approach: right paramedian Location: L3-4 Injection technique: single-shot Needle Needle type: Sprotte  Needle gauge: 24 G Needle length: 9 cm Additional Notes Kit expiration date noted and lot # checked Clear free flow CSF, negative heme, negative paresthesia Tolerated well and returned to supine position

## 2016-08-28 NOTE — Anesthesia Postprocedure Evaluation (Signed)
Anesthesia Post Note  Patient: Teresa Charles  Procedure(s) Performed: Procedure(s) (LRB): RIGHT TOTAL KNEE ARTHROPLASTY (Right)     Patient location during evaluation: PACU Anesthesia Type: Spinal Level of consciousness: awake Pain management: pain level controlled Vital Signs Assessment: post-procedure vital signs reviewed and stable Respiratory status: spontaneous breathing Cardiovascular status: stable Anesthetic complications: no    Last Vitals:  Vitals:   08/28/16 0945 08/28/16 1000  BP: (!) 98/57 129/85  Pulse: 68 65  Resp: 17 (!) 23  Temp:      Last Pain:  Vitals:   08/28/16 0529  TempSrc: Oral                 Gian Ybarra

## 2016-08-28 NOTE — Transfer of Care (Signed)
Immediate Anesthesia Transfer of Care Note  Patient: Teresa Charles  Procedure(s) Performed: Procedure(s): RIGHT TOTAL KNEE ARTHROPLASTY (Right)  Patient Location: PACU  Anesthesia Type:Spinal  Level of Consciousness: awake, alert  and oriented  Airway & Oxygen Therapy: Patient Spontanous Breathing and Patient connected to nasal cannula oxygen  Post-op Assessment: Report given to RN and Post -op Vital signs reviewed and stable  Post vital signs: Reviewed and stable  Last Vitals:  Vitals:   08/28/16 0706 08/28/16 0708  BP:    Pulse: 70 65  Resp: 19 17  Temp:      Last Pain:  Vitals:   08/28/16 0529  TempSrc: Oral      Patients Stated Pain Goal: 4 (08/28/16 0553)  Complications: No apparent anesthesia complications

## 2016-08-28 NOTE — Evaluation (Signed)
Physical Therapy Evaluation Patient Details Name: ROSETTA RUPNOW MRN: [id number] DOB: Apr 03, 1960 Today's Date: 08/28/2016   History of Present Illness  T TKA  Clinical Impression  The patient  Requires cues for safety with RW. Ptient plans Dc with OPPT. To DC today.    Follow Up Recommendations Outpatient PT    Equipment Recommendations  None recommended by PT    Recommendations for Other Services       Precautions / Restrictions Precautions Precautions: Knee      Mobility  Bed Mobility               General bed mobility comments: in recliner  Transfers Overall transfer level: Needs assistance Equipment used: Rolling walker (2 wheeled) Transfers: Sit to/from Stand Sit to Stand: Min guard;Min assist         General transfer comment: cues for safety , pt. impulsive  Ambulation/Gait Ambulation/Gait assistance: Min assist Ambulation Distance (Feet): 100 Feet Assistive device: Rolling walker (2 wheeled) Gait Pattern/deviations: Step-to pattern;Step-through pattern     General Gait Details: patient lifted the RW as it is a SW. multiple cues for safety as patient letting go of RW and walking away from the RW when turning and sitting down, in bathroom walked from toilet without RW, reached to sink to wash hands. reviewed safety, to Use RW until OPPT recommends SPC.  Stairs            Wheelchair Mobility    Modified Rankin (Stroke Patients Only)       Balance                                             Pertinent Vitals/Pain Pain Assessment: 0-10 Pain Score: 5  Pain Location: rt knee Pain Intervention(s): Monitored during session;Patient requesting pain meds-RN notified    Home Living Family/patient expects to be discharged to:: Private residence Living Arrangements: Spouse/significant other;Children Available Help at Discharge: Family Type of Home: House Home Access: Stairs to enter;Ramped entrance Entrance Stairs-Rails:  None Entrance Stairs-Number of Steps: 2 Home Layout: One level Home Equipment: Environmental consultant - 2 wheels;Cane - single point      Prior Function                 Hand Dominance        Extremity/Trunk Assessment   Upper Extremity Assessment Upper Extremity Assessment: Overall WFL for tasks assessed    Lower Extremity Assessment Lower Extremity Assessment: RLE deficits/detail RLE Deficits / Details: knee flexion 5-80, + SLR       Communication      Cognition Arousal/Alertness: Awake/alert Behavior During Therapy: WFL for tasks assessed/performed Overall Cognitive Status: Within Functional Limits for tasks assessed                                        General Comments      Exercises Total Joint Exercises Ankle Circles/Pumps: AROM;Both;10 reps Quad Sets: AROM;Both;10 reps Heel Slides: AROM;Right;10 reps Straight Leg Raises: AROM;Right;10 reps Long Arc Quad: AROM;Right;10 reps   Assessment/Plan    PT Assessment All further PT needs can be met in the next venue of care  PT Problem List Decreased strength;Decreased range of motion;Decreased activity tolerance;Decreased mobility;Decreased safety awareness;Decreased knowledge of precautions       PT Treatment Interventions  PT Goals (Current goals can be found in the Care Plan section)  Acute Rehab PT Goals Patient Stated Goal: go home PT Goal Formulation: All assessment and education complete, DC therapy    Frequency     Barriers to discharge        Co-evaluation               AM-PAC PT "6 Clicks" Daily Activity  Outcome Measure Difficulty turning over in bed (including adjusting bedclothes, sheets and blankets)?: None Difficulty moving from lying on back to sitting on the side of the bed? : None Difficulty sitting down on and standing up from a chair with arms (e.g., wheelchair, bedside commode, etc,.)?: Total Help needed moving to and from a bed to chair (including a  wheelchair)?: Total Help needed walking in hospital room?: Total Help needed climbing 3-5 steps with a railing? : Total 6 Click Score: 12    End of Session   Activity Tolerance: Patient tolerated treatment well Patient left: in chair;with call bell/phone within reach;with family/visitor present;with nursing/sitter in room Nurse Communication: Mobility status PT Visit Diagnosis: Unsteadiness on feet (R26.81);Pain Pain - Right/Left: Right Pain - part of body: Knee    Time: 3754-3606 PT Time Calculation (min) (ACUTE ONLY): 18 min   Charges:   PT Evaluation $PT Eval Low Complexity: 1 Procedure     PT G Codes:   PT G-Codes **NOT FOR INPATIENT CLASS** Functional Assessment Tool Used: AM-PAC 6 Clicks Basic Mobility;Clinical judgement Functional Limitation: Mobility: Walking and moving around Mobility: Walking and Moving Around Current Status (V7034): At least 60 percent but less than 80 percent impaired, limited or restricted Mobility: Walking and Moving Around Goal Status [phone]): At least 60 percent but less than 80 percent impaired, limited or restricted Mobility: Walking and Moving Around Discharge Status [phone]): At least 60 percent but less than 80 percent impaired, limited or restricted   [facility] PT [phone]   Claretha Cooper 08/28/2016, 3:20 PM

## 2016-08-28 NOTE — Op Note (Signed)
NAME:  Teresa Charles                      MEDICAL RECORD NO.:  [id number]                             [facility]      PHYSICIAN:  Madlyn FrankelMatthew D. Charlann Boxerlin, M.D.  DATE OF BIRTH:  05/02/1960      DATE OF PROCEDURE:  08/28/2016                                     OPERATIVE REPORT         PREOPERATIVE DIAGNOSIS:  Right knee osteoarthritis.      POSTOPERATIVE DIAGNOSIS:  Left knee osteoarthritis.      FINDINGS:  The patient was noted to have complete loss of cartilage and   bone-on-bone arthritis with associated osteophytes in the medial and patellofemoral compartments of   the knee with a significant synovitis and associated effusion.      PROCEDURE:  Right total knee replacement.      COMPONENTS USED:  DePuy Attune rotating platform posterior stabilized knee   system, a size 7 femur, 6 tibia, size 7 PS AOX insert, and 38 anatomic patellar   button.      SURGEON:  Madlyn FrankelMatthew D. Charlann Boxerlin, M.D.      ASSISTANT:  Lanney GinsMatthew Babish, PA-C.      ANESTHESIA:  Regional and Spinal.      SPECIMENS:  None.      COMPLICATION:  None.      DRAINS:  None.  EBL: <100cc      TOURNIQUET TIME:   Total Tourniquet Time Documented: Thigh (Right) - 26 minutes Total: Thigh (Right) - 26 minutes  .      The patient was stable to the recovery room.      INDICATION FOR PROCEDURE:  Teresa Charles is a 56 y.o. female patient of   mine.  The patient had been seen, evaluated, and treated conservatively in the   office with medication, activity modification, and injections.  The patient had   radiographic changes of bone-on-bone arthritis with endplate sclerosis and osteophytes noted.      The patient failed conservative measures including medication, injections, and activity modification, and at this point was ready for more definitive measures.   Based on the radiographic changes and failed conservative measures, the patient   decided to proceed with total knee replacement.  Risks of infection,   DVT,  component failure, need for revision surgery, postop course, and   expectations were all   discussed and reviewed.  Consent was obtained for benefit of pain   relief.      PROCEDURE IN DETAIL:  The patient was brought to the operative theater.   Once adequate anesthesia, preoperative antibiotics, 2 gm of Ancef, 1 gm of Tranexamic Acid, and 10 mg of Decadron administered, the patient was positioned supine with the right thigh tourniquet placed.  The  right lower extremity was prepped and draped in sterile fashion.  A time-   out was performed identifying the patient, planned procedure, and   extremity.      The right lower extremity [facility].  The leg was   exsanguinated, tourniquet elevated to 250 mmHg.  A midline incision was  made followed by median parapatellar arthrotomy.  Following initial   exposure, attention was first directed to the patella.  Precut   measurement was noted to be 22 mm.  I resected down to 14 mm and used a   38 anatomic patellar button to restore patellar height as well as cover the cut   surface.      The lug holes were drilled and a metal shim was placed to protect the   patella from retractors and saw blades.      At this point, attention was now directed to the femur.  The femoral   canal was opened with a drill, irrigated to try to prevent fat emboli.  An   intramedullary rod was passed at 3 degrees valgus, 9 mm of bone was   resected off the distal femur.  Following this resection, the tibia was   subluxated anteriorly.  Using the extramedullary guide, 2 mm of bone was resected off   the proximal medial tibia.  We confirmed the gap would be   stable medially and laterally with a size 5 spacer block as well as confirmed   the cut was perpendicular in the coronal plane, checking with an alignment rod.      Once this was done, I sized the femur to be a size 7 in the anterior-   posterior dimension, chose a standard component  based on medial and   lateral dimension.  The size 7 rotation block was then pinned in   position anterior referenced using the C-clamp to set rotation.  The   anterior, posterior, and  chamfer cuts were made without difficulty nor   notching making certain that I was along the anterior cortex to help   with flexion gap stability.      The final box cut was made off the lateral aspect of distal femur.      At this point, the tibia was sized to be a size 6, the size 6 tray was   then pinned in position through the medial third of the tubercle,   drilled, and keel punched.  Trial reduction was now carried with a 7 femur,  6 tibia, a size 6 then 7 PS insert, and the 38 anatomic patella botton.  The knee was brought to   extension, full extension with good flexion stability with the patella   tracking through the trochlea without application of pressure.  Given   all these findings the femoral lug holes were drilled and then the trial components removed.  Final components were   opened and cement was mixed.  The knee was irrigated with normal saline   solution and pulse lavage.  The synovial lining was   then injected with 20 cc of 0.25% Marcaine with epinephrine and 1 cc of Toradol plus 30 cc of NS for a  total of 51 cc.      The knee was irrigated.  Final implants were then cemented onto clean and   dried cut surfaces of bone with the knee brought to extension with a size 7 PS trial insert.      Once the cement had fully cured, the excess cement was removed   throughout the knee.  I confirmed I was satisfied with the range of   motion and stability, and the final size 7 PS AOX insert was chosen.  It was   placed into the knee.      The tourniquet had been let down at  26 minutes.  No significant   hemostasis required.  The   extensor mechanism was then reapproximated using #1 Vicryl with the knee   in flexion.  The   remaining wound was closed with 2-0 Vicryl and running 4-0 Monocryl.    The knee was cleaned, dried, dressed sterilely using Dermabond and   Aquacel dressing.  The patient was then   brought to recovery room in stable condition, tolerating the procedure   well.   Please note that Physician Assistant, Lanney Gins, PA-C, was present for the entirety of the case, and was utilized for pre-operative positioning, peri-operative retractor management, general facilitation of the procedure.  He was also utilized for primary wound closure at the end of the case.              Madlyn Frankel Charlann Boxer, M.D.    08/28/2016 8:44 AM

## 2016-08-29 ENCOUNTER — Ambulatory Visit (HOSPITAL_COMMUNITY): Payer: No Typology Code available for payment source | Attending: Orthopedic Surgery | Admitting: Physical Therapy

## 2016-08-29 ENCOUNTER — Encounter (HOSPITAL_COMMUNITY): Payer: Self-pay | Admitting: Physical Therapy

## 2016-08-29 DIAGNOSIS — R262 Difficulty in walking, not elsewhere classified: Secondary | ICD-10-CM | POA: Insufficient documentation

## 2016-08-29 DIAGNOSIS — R600 Localized edema: Secondary | ICD-10-CM | POA: Insufficient documentation

## 2016-08-29 DIAGNOSIS — M25661 Stiffness of right knee, not elsewhere classified: Secondary | ICD-10-CM | POA: Diagnosis present

## 2016-08-29 DIAGNOSIS — M6281 Muscle weakness (generalized): Secondary | ICD-10-CM

## 2016-08-29 NOTE — Therapy (Signed)
[facility] [address] [place], [place], [address] Phone: [phone]   Fax:  [phone]  Physical Therapy Evaluation  Patient Details  Name: Teresa Charles MRN: [id number] Date of Birth: [birth year] Referring Provider: Lanney Gins   Encounter Date: 08/29/2016      PT End of Session - 08/29/16 1744    Visit Number 1   Number of Visits 19   Date for PT Re-Evaluation 09/19/16   Authorization Type [employer]    Authorization Time Period 08/29/16 to 10/11/16   PT Start Time 1700   PT Stop Time 1738   PT Time Calculation (min) 38 min   Activity Tolerance Patient limited by pain   Behavior During Therapy [facility] for tasks assessed/performed      Past Medical History:  Diagnosis Date  . Anxiety   . Arthritis   . Depression   . Dysrhythmia    palpitations  . Hypothyroidism     Past Surgical History:  Procedure Laterality Date  . CARDIAC CATHETERIZATION  2009  . TOTAL KNEE ARTHROPLASTY Left 12/08/2013   Procedure: LEFT TOTAL KNEE ARTHROPLASTY;  Surgeon: Shelda Pal, MD;  Location: [facility];  Service: Orthopedics;  Laterality: Left;  . TOTAL KNEE ARTHROPLASTY Right 08/28/2016   Procedure: RIGHT TOTAL KNEE ARTHROPLASTY;  Surgeon: Durene Romans, MD;  Location: [facility];  Service: Orthopedics;  Laterality: Right;    There were no vitals filed for this visit.       Subjective Assessment - 08/29/16 1704    Subjective Patient states that her R knee has been a problem for some time, they have just gotten worse over the years. She has had quite a bit of falls recently due to slipping on water/baby powder at work. She isn ot getting HHPT for this, she was sent right to outpatient PT. She does have a walker at home.    Pertinent History history L TKA, R TKA done 7/2, hypothyroidsm    How long can you sit comfortably? immediate discomofrt (although she is sitting in WC at time of eval)   How long can you stand comfortably? immediate pain     How long can you walk comfortably? immediate pain    Patient Stated Goals get back to work at the [employer]    Currently in Pain? Yes   Pain Score 8    Pain Location Other (Comment)  R leg    Pain Orientation Right   Pain Descriptors / Indicators Other (Comment);Aching  deep, constant    Pain Type Surgical pain   Pain Radiating Towards entire R LE    Pain Onset Yesterday   Pain Frequency Constant   Aggravating Factors  everything right now    Pain Relieving Factors ice    Effect of Pain on Daily Activities severe            [facility] PT Assessment - 08/29/16 0001      Assessment   Medical Diagnosis R TKR    Referring Provider Lanney Gins    Onset Date/Surgical Date 08/28/16   Next MD Visit Dr. Voncille Lo 7/16   Prior Therapy PT for her L knee 3 years ago      Precautions   Precautions Fall     Balance Screen   Has the patient fallen in the past 6 months Yes   How many times? always at work/always slipping on water; unsure how many    Has the patient had a decrease  in activity level because of a fear of falling?  No   Is the patient reluctant to leave their home because of a fear of falling?  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    Leisure outside activities, gardneing      AROM   Right Knee Extension 19   Right Knee Flexion 69     Strength   Right Hip Flexion 3-/5  pain limited    Right Hip Extension 3-/5   Right Hip ABduction 4/5   Left Hip Flexion 3/5   Left Hip Extension 3/5   Left Hip ABduction 4-/5   Right Knee Flexion 3-/5  ROM limited    Right Knee Extension 3-/5  ROM limited    Left Knee Flexion 5/5   Left Knee Extension 5/5   Right Ankle Dorsiflexion 5/5   Left Ankle Dorsiflexion 5/5     Ambulation/Gait   Gait Comments antalgic pattern, reduced  flexion/extension R knee, reduced stance time R knee            Objective measurements completed on examination: See above findings.                  PT Education - 08/29/16 1743    Education provided Yes   Education Details call MD with concerns regarding pain medicine/ high pain at this time; prognosis, HEP, POC    Person(s) Educated Patient;Spouse   Methods Explanation;Demonstration;Handout   Comprehension Verbalized understanding;Returned demonstration;Need further instruction          PT Short Term Goals - 08/29/16 1749      PT SHORT TERM GOAL #1   Title Patient to demonstrate R knee ROM as being 0-115 degrees in order to improve mechanics and reduce pain    Time 3   Period Weeks   Status New     PT SHORT TERM GOAL #2   Title Patient to be able to consistently ambulate with heel-toe pattern with LRAD, no increase in pain and unlimited distances, in order to improve general access to home and community    Time 3   Period Weeks   Status New     PT SHORT TERM GOAL #3   Title Patient to experience pain R knee/LE as being no more than 5/10 in order to improve QOL and exercise tolerance    Time 3   Period Weeks   Status New     PT SHORT TERM GOAL #4   Title Patient to be compliant in correct performace of HEP, to be updated PRN    Time 1   Period Weeks   Status New           PT Long Term Goals - 08/29/16 1751      PT LONG TERM GOAL #1   Title Pateint to demonstrate functional strength as being 5/5 in all tested groups in order to improve mechanics and activity tolerance    Time 6   Period Weeks   Status New     PT LONG TERM GOAL #2   Title Patient to experience pain as being no more than 2/10 in order to improve QOL and facilitate return to work    Time 6   Period Weeks   Status New     PT LONG TERM GOAL #3   Title Patient to  be able to perform on-feet tasks for at least 4 hours with no increase in pain in order to facilitate ability to  return to work    Time 6   Period Weeks   Status New     PT LONG TERM GOAL #4   Title Patient to be able to ambulate with no device unlimited distances and will also be able to reciprocally ascend/descent full flight of stairs with U railing and good eccentric control in order to improve home and community access    Time 6   Period Weeks   Status New                Plan - 08/29/16 1746    Clinical Impression Statement Patient arrives 1 day after R TKR surgery, in high levels of pain but willing to participate with skilled PT services. Exam reveals typical presentation for R TKR, including significant localized edema, R knee stiffness, significant gait impairment, general unsteadiness, and inability to tolerate PLOF based activities. Patient will benefit from skilled PT services to address functional deficits, reduce pain, and assist in return to optimal level of function.    History and Personal Factors relevant to plan of care: success with L TKR recovery; R TKR done 08/28/16. PMH otherwise generally unremarkable    Clinical Presentation Stable   Clinical Presentation due to: day 1 post-op status    Clinical Decision Making Low   Rehab Potential Good   Clinical Impairments Affecting Rehab Potential (+) success with PT for TKR in the past, motivated to participate, quick referral to rehab post-surgery   PT Frequency 3x / week   PT Duration 6 weeks   PT Treatment/Interventions ADLs/Self Care Home Management;Biofeedback;Cryotherapy;Moist Heat;DME Instruction;Gait training;Stair training;Functional mobility training;Therapeutic activities;Therapeutic exercise;Balance training;Neuromuscular re-education;Patient/family education;Manual techniques;Scar mobilization;Passive range of motion;Dry needling;Energy conservation   PT Next Visit Plan review initial eval/goals, HEP; focus on pain and edema control, gait mechanics, ROM at first. When ROM is 0-115 degrees, may begin strength training.     PT Home Exercise Plan Eval: quad sets, heel slides, lateral weight shifts, knee flexion stretch    Consulted and Agree with Plan of Care Patient;Family member/caregiver   Family Member Consulted spouse       Patient will benefit from skilled therapeutic intervention in order to improve the following deficits and impairments:  Abnormal gait, Decreased skin integrity, Increased fascial restricitons, Pain, Decreased coordination, Decreased mobility, Decreased scar mobility, Increased muscle spasms, Decreased activity tolerance, Decreased range of motion, Decreased strength, Hypomobility, Decreased balance, Difficulty walking, Increased edema, Impaired flexibility  Visit Diagnosis: Stiffness of right knee, not elsewhere classified - Plan: PT plan of care cert/re-cert  Localized edema - Plan: PT plan of care cert/re-cert  Muscle weakness (generalized) - Plan: PT plan of care cert/re-cert  Difficulty in walking, not elsewhere classified - Plan: PT plan of care cert/re-cert     Problem List Patient Active Problem List   Diagnosis Date Noted  . S/P right TKA 08/28/2016  . Primary osteoarthritis of left knee 01/01/2014  . Knee stiffness 01/01/2014  . Knee pain, left 01/01/2014  . Difficulty walking 01/01/2014  . S/P left TKA 12/08/2013    Nedra Hai PT, DPT [phone]  [facility] [address] [place], [place], [address] Phone: [phone]   Fax:  [phone]  Name: AUSTINE KELSAY MRN: [id number] Date of Birth: 25-Nov-1960

## 2016-08-29 NOTE — Patient Instructions (Signed)
   QUAD SET  Tighten your top thigh muscle as you attempt to press the back of your knee downward towards the table.  Hold for at least 3 seconds.  Repeat 15 times, at least 3 times per day.     HEEL SLIDES - SUPINE  Lying on your back with knees straight, slide the affected heel towards your buttock as you bend your knee.   Hold a gentle stretch in this position for 3 seconds and then return to original position.  You may use a sheet to help like we did in clinic.  Repeat 15 times, at least 3 times per day.    KNEE FLEXION STRETCH - SELF ASSISTED  While seated in a chair, use your unaffected leg to bend your affected knee until a stretch is felt.  Hold for 3 seconds.  Repeat 10-15 times, at least 3 times per day.    WEIGHT SHIFT - LATERAL  While in a standing position and knees partially bent, slowly shift your body weight side-to-side.   Put as much weight down through your right leg as you can tolerate.  Repeat 10 times, twice a day.

## 2016-09-01 ENCOUNTER — Ambulatory Visit (HOSPITAL_COMMUNITY): Payer: No Typology Code available for payment source | Admitting: Physical Therapy

## 2016-09-01 DIAGNOSIS — R600 Localized edema: Secondary | ICD-10-CM

## 2016-09-01 DIAGNOSIS — M6281 Muscle weakness (generalized): Secondary | ICD-10-CM

## 2016-09-01 DIAGNOSIS — M25661 Stiffness of right knee, not elsewhere classified: Secondary | ICD-10-CM | POA: Diagnosis not present

## 2016-09-01 DIAGNOSIS — R262 Difficulty in walking, not elsewhere classified: Secondary | ICD-10-CM

## 2016-09-01 NOTE — Therapy (Signed)
[facility] [address] [place], [place], [address] Phone: [phone]   Fax:  [phone]  Physical Therapy Treatment  Patient Details  Name: Teresa Charles MRN: [id number] Date of Birth: 07/10/1960 Referring Provider: Lanney Gins   Encounter Date: 09/01/2016      PT End of Session - 09/01/16 1348    Visit Number 2   Number of Visits 19   Date for PT Re-Evaluation 09/19/16   Authorization Type [employer]    Authorization Time Period 08/29/16 to 10/11/16   PT Start Time 1301   PT Stop Time 1344   PT Time Calculation (min) 43 min   Activity Tolerance Patient tolerated treatment well;No increased pain   Behavior During Therapy WFL for tasks assessed/performed      Past Medical History:  Diagnosis Date  . Anxiety   . Arthritis   . Depression   . Dysrhythmia    palpitations  . Hypothyroidism     Past Surgical History:  Procedure Laterality Date  . CARDIAC CATHETERIZATION  2009  . TOTAL KNEE ARTHROPLASTY Left 12/08/2013   Procedure: LEFT TOTAL KNEE ARTHROPLASTY;  Surgeon: Shelda Pal, MD;  Location: [facility];  Service: Orthopedics;  Laterality: Left;  . TOTAL KNEE ARTHROPLASTY Right 08/28/2016   Procedure: RIGHT TOTAL KNEE ARTHROPLASTY;  Surgeon: Durene Romans, MD;  Location: [facility];  Service: Orthopedics;  Laterality: Right;    There were no vitals filed for this visit.      Subjective Assessment - 09/01/16 1259    Subjective Pt reports that things are ok. Her knee is bothering her today, to the point where she did take a pain pill despite it causing her to feel nausseous. She is not doing her exercises at home, but thinks that she did enough walking to the bathroom.    Pertinent History history L TKA, R TKA done 7/2, hypothyroidsm    How long can you sit comfortably? immediate discomofrt (although she is sitting in WC at time of eval)   How long can you stand comfortably? immediate pain    How long can you walk  comfortably? immediate pain    Patient Stated Goals get back to work at the [employer]    Currently in Pain? Yes   Pain Score 8    Pain Location Knee   Pain Orientation Right   Pain Descriptors / Indicators Aching   Pain Type Surgical pain   Pain Radiating Towards none    Pain Onset Yesterday   Pain Frequency Constant   Aggravating Factors  anything    Pain Relieving Factors ice    Effect of Pain on Daily Activities severe              [facility] Adult PT Treatment/Exercise - 09/01/16 0001      Ambulation/Gait   Gait Comments antalgic pattern, x239ft with RW. Therapist providing verbal cues to increase heel strike and knee flexion on the Rt.      Exercises   Exercises Knee/Hip     Knee/Hip Exercises: Standing   Other Standing Knee Exercises retrorocking in // bars 2x60" with LLE forward      Knee/Hip Exercises: Supine   Short Arc Quad Sets Both;1 set;15 reps   Heel Slides Right;1 set;15 reps   Heel Slides Limitations with quad/glute set end range    Knee Flexion Limitations hip abduction x10 reps    Other Supine Knee/Hip Exercises ankle pump with LE elevated x25-30 reps  Manual Therapy   Manual Therapy Edema management   Manual therapy comments separate rest of session   Edema Management Gentle retrograde massage proximal to distal, LE elevated   Ice applied to posterior knee during edema management            PT Education - 09/01/16 1346    Education provided Yes   Education Details discussed importance of HEP adherence; encouraged pt to wear compression garment provided by MD to decrease swelling; importance of ambulating with proper mechanics to facilitate improvements in ROM/strength    Person(s) Educated Patient   Methods Explanation;Tactile cues;Verbal cues;Handout   Comprehension Verbalized understanding;Returned demonstration          PT Short Term Goals - 08/29/16 1749      PT SHORT TERM GOAL #1   Title Patient to demonstrate R knee ROM as  being 0-115 degrees in order to improve mechanics and reduce pain    Time 3   Period Weeks   Status New     PT SHORT TERM GOAL #2   Title Patient to be able to consistently ambulate with heel-toe pattern with LRAD, no increase in pain and unlimited distances, in order to improve general access to home and community    Time 3   Period Weeks   Status New     PT SHORT TERM GOAL #3   Title Patient to experience pain R knee/LE as being no more than 5/10 in order to improve QOL and exercise tolerance    Time 3   Period Weeks   Status New     PT SHORT TERM GOAL #4   Title Patient to be compliant in correct performace of HEP, to be updated PRN    Time 1   Period Weeks   Status New           PT Long Term Goals - 08/29/16 1751      PT LONG TERM GOAL #1   Title Pateint to demonstrate functional strength as being 5/5 in all tested groups in order to improve mechanics and activity tolerance    Time 6   Period Weeks   Status New     PT LONG TERM GOAL #2   Title Patient to experience pain as being no more than 2/10 in order to improve QOL and facilitate return to work    Time 6   Period Weeks   Status New     PT LONG TERM GOAL #3   Title Patient to be able to perform on-feet tasks for at least 4 hours with no increase in pain in order to facilitate ability to return to work    Time 6   Period Weeks   Status New     PT LONG TERM GOAL #4   Title Patient to be able to ambulate with no device unlimited distances and will also be able to reciprocally ascend/descent full flight of stairs with U railing and good eccentric control in order to improve home and community access    Time 6   Period Weeks   Status New           Plan - 09/01/16 1348    Clinical Impression Statement Pt arrived today reporting fair HEP adherence since her evaluation secondary to high pain levels. Session began with goals review and gait training to improve heel strike and knee flexion with RW. Also  focused on therex and activities to address knee ROM/strength and ended the session with  manual techniques and ice/elevation to decrease edema and pain. Pt reporting feeling much better at the end of the session than she did prior to its start. Therapist educated pt on the importance of HEP adherence and provided another copy of her exercises from the evaluation. She verbalized understanding at this time.    Rehab Potential Good   Clinical Impairments Affecting Rehab Potential (+) success with PT for TKR in the past, motivated to participate, quick referral to rehab post-surgery   PT Frequency 3x / week   PT Duration 6 weeks   PT Treatment/Interventions ADLs/Self Care Home Management;Biofeedback;Cryotherapy;Moist Heat;DME Instruction;Gait training;Stair training;Functional mobility training;Therapeutic activities;Therapeutic exercise;Balance training;Neuromuscular re-education;Patient/family education;Manual techniques;Scar mobilization;Passive range of motion;Dry needling;Energy conservation   PT Next Visit Plan continue to reinforce gait mechanics; knee extension/flexion ROM; focus on pain and edema control.   PT Home Exercise Plan Eval: quad sets, heel slides, lateral weight shifts, knee flexion stretch    Consulted and Agree with Plan of Care Patient;Family member/caregiver   Family Member Consulted spouse       Patient will benefit from skilled therapeutic intervention in order to improve the following deficits and impairments:  Abnormal gait, Decreased skin integrity, Increased fascial restricitons, Pain, Decreased coordination, Decreased mobility, Decreased scar mobility, Increased muscle spasms, Decreased activity tolerance, Decreased range of motion, Decreased strength, Hypomobility, Decreased balance, Difficulty walking, Increased edema, Impaired flexibility  Visit Diagnosis: Stiffness of right knee, not elsewhere classified  Localized edema  Muscle weakness (generalized)  Difficulty  in walking, not elsewhere classified     Problem List Patient Active Problem List   Diagnosis Date Noted  . S/P right TKA 08/28/2016  . Primary osteoarthritis of left knee 01/01/2014  . Knee stiffness 01/01/2014  . Knee pain, left 01/01/2014  . Difficulty walking 01/01/2014  . S/P left TKA 12/08/2013    2:03 PM,09/01/16 Marylyn IshiharaSara Kiser PT, DPT [facility] [phone]  [facility] [address] [place], [place], [address] Phone: [phone]   Fax:  [phone]  Name: Teresa Charles MRN: [id number] Date of Birth: 1960-11-15

## 2016-09-04 ENCOUNTER — Ambulatory Visit (HOSPITAL_COMMUNITY): Payer: No Typology Code available for payment source | Admitting: Physical Therapy

## 2016-09-04 DIAGNOSIS — M25661 Stiffness of right knee, not elsewhere classified: Secondary | ICD-10-CM

## 2016-09-04 DIAGNOSIS — R262 Difficulty in walking, not elsewhere classified: Secondary | ICD-10-CM

## 2016-09-04 DIAGNOSIS — R600 Localized edema: Secondary | ICD-10-CM

## 2016-09-04 DIAGNOSIS — M6281 Muscle weakness (generalized): Secondary | ICD-10-CM

## 2016-09-04 NOTE — Therapy (Signed)
[facility] [address] [place], [place], [address] Phone: [phone]   Fax:  [phone]  Physical Therapy Treatment  Patient Details  Name: Teresa Charles MRN: [id number] Date of Birth: 02/05/1961 Referring Provider: Lanney Gins   Encounter Date: 09/04/2016      PT End of Session - 09/04/16 1712    Visit Number 3   Number of Visits 19   Date for PT Re-Evaluation 09/19/16   Authorization Type [employer]    Authorization Time Period 08/29/16 to 10/11/16   PT Start Time 1530  patient arrived late    PT Stop Time 1558   PT Time Calculation (min) 28 min   Activity Tolerance Patient tolerated treatment well   Behavior During Therapy [facility] for tasks assessed/performed      Past Medical History:  Diagnosis Date  . Anxiety   . Arthritis   . Depression   . Dysrhythmia    palpitations  . Hypothyroidism     Past Surgical History:  Procedure Laterality Date  . CARDIAC CATHETERIZATION  2009  . TOTAL KNEE ARTHROPLASTY Left 12/08/2013   Procedure: LEFT TOTAL KNEE ARTHROPLASTY;  Surgeon: Shelda Pal, MD;  Location: [facility];  Service: Orthopedics;  Laterality: Left;  . TOTAL KNEE ARTHROPLASTY Right 08/28/2016   Procedure: RIGHT TOTAL KNEE ARTHROPLASTY;  Surgeon: Durene Romans, MD;  Location: [facility];  Service: Orthopedics;  Laterality: Right;    There were no vitals filed for this visit.      Subjective Assessment - 09/04/16 1540    Subjective patient arrives stating that she was diagnosed with shingles- she has not been to the MD for anti-viral medication and the area is not exposed (it is on her rear).    Patient Stated Goals get back to work at the [employer]    Currently in Pain? Yes   Pain Score 7    Pain Location Knee   Pain Orientation Right                         [facility] Adult PT Treatment/Exercise - 09/04/16 0001      Knee/Hip Exercises: Stretches   Active Hamstring Stretch Right;3 reps;30 seconds    Gastroc Stretch Both;3 reps;30 seconds     Knee/Hip Exercises: Seated   Long Arc Quad Right;1 set;15 reps   Other Seated Knee/Hip Exercises seated knee flexion stretch 1x0x10 seconds      Knee/Hip Exercises: Supine   Quad Sets Right;1 set;20 reps   Other Supine Knee/Hip Exercises overpressure for knee extension and flexion in supine/prone respectively     Knee/Hip Exercises: Prone   Hamstring Curl 1 set;15 reps   Hamstring Curl Limitations overpressure as tolerated                 PT Education - 09/04/16 1710    Education provided Yes   Education Details need to see MD about possible anti-virals to reduce contagion of shingles- otherwise she will need to weaer mask to PT due to airbrone precautions    Person(s) Educated Patient   Methods Explanation   Comprehension Verbalized understanding;Need further instruction          PT Short Term Goals - 08/29/16 1749      PT SHORT TERM GOAL #1   Title Patient to demonstrate R knee ROM as being 0-115 degrees in order to improve mechanics and reduce pain    Time 3   Period Weeks  Status New     PT SHORT TERM GOAL #2   Title Patient to be able to consistently ambulate with heel-toe pattern with LRAD, no increase in pain and unlimited distances, in order to improve general access to home and community    Time 3   Period Weeks   Status New     PT SHORT TERM GOAL #3   Title Patient to experience pain R knee/LE as being no more than 5/10 in order to improve QOL and exercise tolerance    Time 3   Period Weeks   Status New     PT SHORT TERM GOAL #4   Title Patient to be compliant in correct performace of HEP, to be updated PRN    Time 1   Period Weeks   Status New           PT Long Term Goals - 08/29/16 1751      PT LONG TERM GOAL #1   Title Pateint to demonstrate functional strength as being 5/5 in all tested groups in order to improve mechanics and activity tolerance    Time 6   Period Weeks   Status New      PT LONG TERM GOAL #2   Title Patient to experience pain as being no more than 2/10 in order to improve QOL and facilitate return to work    Time 6   Period Weeks   Status New     PT LONG TERM GOAL #3   Title Patient to be able to perform on-feet tasks for at least 4 hours with no increase in pain in order to facilitate ability to return to work    Time 6   Period Weeks   Status New     PT LONG TERM GOAL #4   Title Patient to be able to ambulate with no device unlimited distances and will also be able to reciprocally ascend/descent full flight of stairs with U railing and good eccentric control in order to improve home and community access    Time 6   Period Weeks   Status New               Plan - 09/04/16 1713    Clinical Impression Statement  Patient arrives late and reports that she has been diagnosed with shingles; she later discloses she is not on any active anti-viral medications to treat shingles- area that shingles is affecting is covered/not exposed and patient willing to wear mask during session (rehab supervisor OK with continuing session within these parameters). Encouraged patient to see PCP for possible anti-viral medication for shingles. Otherwise worked on ROM based activities in one treatment room today and thoroughly cleaned area at end of session.    Rehab Potential Good   Clinical Impairments Affecting Rehab Potential (+) success with PT for TKR in the past, motivated to participate, quick referral to rehab post-surgery   PT Frequency 3x / week   PT Duration 6 weeks   PT Treatment/Interventions ADLs/Self Care Home Management;Biofeedback;Cryotherapy;Moist Heat;DME Instruction;Gait training;Stair training;Functional mobility training;Therapeutic activities;Therapeutic exercise;Balance training;Neuromuscular re-education;Patient/family education;Manual techniques;Scar mobilization;Passive range of motion;Dry needling;Energy conservation   PT Next Visit Plan NEEDS  TO BE IN MASK/LIMIT TX AREA UNTIL SHE IS ON ANTI-VIRALS FOR SHINGLES. WIPE DOWN TX AREA AFTER. Continue ROM work, edema control, balance, gait training.    PT Home Exercise Plan Eval: quad sets, heel slides, lateral weight shifts, knee flexion stretch    Consulted and Agree with Plan of Care  Patient      Patient will benefit from skilled therapeutic intervention in order to improve the following deficits and impairments:  Abnormal gait, Decreased skin integrity, Increased fascial restricitons, Pain, Decreased coordination, Decreased mobility, Decreased scar mobility, Increased muscle spasms, Decreased activity tolerance, Decreased range of motion, Decreased strength, Hypomobility, Decreased balance, Difficulty walking, Increased edema, Impaired flexibility  Visit Diagnosis: Stiffness of right knee, not elsewhere classified  Localized edema  Muscle weakness (generalized)  Difficulty in walking, not elsewhere classified     Problem List Patient Active Problem List   Diagnosis Date Noted  . S/P right TKA 08/28/2016  . Primary osteoarthritis of left knee 01/01/2014  . Knee stiffness 01/01/2014  . Knee pain, left 01/01/2014  . Difficulty walking 01/01/2014  . S/P left TKA 12/08/2013    Nedra Hai PT, DPT [id number]  [facility] [address] [place], [place], [address] Phone: [phone]   Fax:  [phone]  Name: Teresa Charles MRN: [id number] Date of Birth: 11/16/1960

## 2016-09-05 ENCOUNTER — Telehealth (HOSPITAL_COMMUNITY): Payer: Self-pay | Admitting: Physician Assistant

## 2016-09-05 NOTE — Telephone Encounter (Signed)
09/05/16  pt called to cx and just said that she wouldn't be here

## 2016-09-06 ENCOUNTER — Ambulatory Visit (HOSPITAL_COMMUNITY): Payer: No Typology Code available for payment source | Admitting: Physical Therapy

## 2016-09-08 ENCOUNTER — Ambulatory Visit (HOSPITAL_COMMUNITY): Payer: No Typology Code available for payment source | Admitting: Physical Therapy

## 2016-09-08 DIAGNOSIS — M6281 Muscle weakness (generalized): Secondary | ICD-10-CM

## 2016-09-08 DIAGNOSIS — M25661 Stiffness of right knee, not elsewhere classified: Secondary | ICD-10-CM | POA: Diagnosis not present

## 2016-09-08 DIAGNOSIS — R262 Difficulty in walking, not elsewhere classified: Secondary | ICD-10-CM

## 2016-09-08 DIAGNOSIS — R600 Localized edema: Secondary | ICD-10-CM

## 2016-09-08 NOTE — Therapy (Signed)
[facility] [address] [place], [place], [address] Phone: [phone]   Fax:  [phone]  Physical Therapy Treatment  Patient Details  Name: Teresa Charles MRN: [id number] Date of Birth: 1960/05/31 Referring Provider: Lanney Gins   Encounter Date: 09/08/2016      PT End of Session - 09/08/16 1206    Visit Number 4   Number of Visits 19   Date for PT Re-Evaluation 09/19/16   Authorization Type [employer]    Authorization Time Period 08/29/16 to 10/11/16   PT Start Time 0954   PT Stop Time 1030   PT Time Calculation (min) 36 min   Activity Tolerance Patient tolerated treatment well;No increased pain   Behavior During Therapy WFL for tasks assessed/performed      Past Medical History:  Diagnosis Date  . Anxiety   . Arthritis   . Depression   . Dysrhythmia    palpitations  . Hypothyroidism     Past Surgical History:  Procedure Laterality Date  . CARDIAC CATHETERIZATION  2009  . TOTAL KNEE ARTHROPLASTY Left 12/08/2013   Procedure: LEFT TOTAL KNEE ARTHROPLASTY;  Surgeon: Shelda Pal, MD;  Location: [facility];  Service: Orthopedics;  Laterality: Left;  . TOTAL KNEE ARTHROPLASTY Right 08/28/2016   Procedure: RIGHT TOTAL KNEE ARTHROPLASTY;  Surgeon: Durene Romans, MD;  Location: [facility];  Service: Orthopedics;  Laterality: Right;    There were no vitals filed for this visit.      Subjective Assessment - 09/08/16 0956    Subjective Pt arrives stating she saw her MD who gave her antiviral medication to take. She tried doing her exercises some yesterday but has not been doing much other than that. She does feel that her swelling has improved.    Patient Stated Goals get back to work at the [employer]    Currently in Pain? Yes   Pain Score 7    Pain Location Knee   Pain Orientation Right   Pain Descriptors / Indicators Aching   Pain Type Surgical pain   Pain Radiating Towards none    Pain Onset Yesterday   Pain Frequency  Constant   Aggravating Factors  being up on her feet   Pain Relieving Factors ice             [facility] PT Assessment - 09/08/16 0001      AROM   Right Knee Extension 14   Right Knee Flexion 100                     [facility] Adult PT Treatment/Exercise - 09/08/16 0001      Ambulation/Gait   Gait Comments antalgic pattern, x248ft with RW. Therapist providing verbal cues to increase heel strike and knee flexion on the Rt.      Knee/Hip Exercises: Standing   Other Standing Knee Exercises Standing marching with ipsilateral UE flexion, facing the wall x10 reps each side     Knee/Hip Exercises: Seated   Other Seated Knee/Hip Exercises seated knee extension stretch with overpressure, 4# weight 2x51min; seated knee flexion stretch with gentle over pressure 10x10 sec      Knee/Hip Exercises: Supine   Quad Sets Right;2 sets;15 reps   Quad Sets Limitations 2 sec hold.   Heel Slides AAROM;Right;15 reps;2 sets   Heel Slides Limitations 5 sec hold                 PT Education - 09/08/16 1022  Education provided Yes   Education Details importance of addressing knee ROM at home with more consistent HEP adherence; technique with therex.   Person(s) Educated Patient   Methods Explanation;Demonstration;Handout   Comprehension Verbalized understanding;Returned demonstration          PT Short Term Goals - 08/29/16 1749      PT SHORT TERM GOAL #1   Title Patient to demonstrate R knee ROM as being 0-115 degrees in order to improve mechanics and reduce pain    Time 3   Period Weeks   Status New     PT SHORT TERM GOAL #2   Title Patient to be able to consistently ambulate with heel-toe pattern with LRAD, no increase in pain and unlimited distances, in order to improve general access to home and community    Time 3   Period Weeks   Status New     PT SHORT TERM GOAL #3   Title Patient to experience pain R knee/LE as being no more than 5/10 in order to improve QOL and  exercise tolerance    Time 3   Period Weeks   Status New     PT SHORT TERM GOAL #4   Title Patient to be compliant in correct performace of HEP, to be updated PRN    Time 1   Period Weeks   Status New           PT Long Term Goals - 08/29/16 1751      PT LONG TERM GOAL #1   Title Pateint to demonstrate functional strength as being 5/5 in all tested groups in order to improve mechanics and activity tolerance    Time 6   Period Weeks   Status New     PT LONG TERM GOAL #2   Title Patient to experience pain as being no more than 2/10 in order to improve QOL and facilitate return to work    Time 6   Period Weeks   Status New     PT LONG TERM GOAL #3   Title Patient to be able to perform on-feet tasks for at least 4 hours with no increase in pain in order to facilitate ability to return to work    Time 6   Period Weeks   Status New     PT LONG TERM GOAL #4   Title Patient to be able to ambulate with no device unlimited distances and will also be able to reciprocally ascend/descent full flight of stairs with U railing and good eccentric control in order to improve home and community access    Time 6   Period Weeks   Status New               Plan - 09/08/16 1207    Clinical Impression Statement Pt arrived today with continued post-op soreness and stiffness. Session began with education regarding proper heel/toe pattern with ambulation and continued with therex to address knee extension/flexion ROM. Pt demonstrated improvements in knee flexion following today's session, however knee extension continues to be significantly limited. Therapist discussed the importance of consistent HEP adherence and pt verbalized understanding. Will continue with current POC.    Rehab Potential Good   Clinical Impairments Affecting Rehab Potential (+) success with PT for TKR in the past, motivated to participate, quick referral to rehab post-surgery   PT Frequency 3x / week   PT Duration 6  weeks   PT Treatment/Interventions ADLs/Self Care Home Management;Biofeedback;Cryotherapy;Moist Heat;DME Instruction;Gait training;Stair training;Functional  mobility training;Therapeutic activities;Therapeutic exercise;Balance training;Neuromuscular re-education;Patient/family education;Manual techniques;Scar mobilization;Passive range of motion;Dry needling;Energy conservation   PT Next Visit Plan heavy focus on knee extension PROM; gait training with RW and SPC if able    PT Home Exercise Plan Eval: quad sets, heel slides, lateral weight shifts, knee flexion stretch, knee extension stretch    Consulted and Agree with Plan of Care Patient      Patient will benefit from skilled therapeutic intervention in order to improve the following deficits and impairments:  Abnormal gait, Decreased skin integrity, Increased fascial restricitons, Pain, Decreased coordination, Decreased mobility, Decreased scar mobility, Increased muscle spasms, Decreased activity tolerance, Decreased range of motion, Decreased strength, Hypomobility, Decreased balance, Difficulty walking, Increased edema, Impaired flexibility  Visit Diagnosis: Stiffness of right knee, not elsewhere classified  Localized edema  Muscle weakness (generalized)  Difficulty in walking, not elsewhere classified     Problem List Patient Active Problem List   Diagnosis Date Noted  . S/P right TKA 08/28/2016  . Primary osteoarthritis of left knee 01/01/2014  . Knee stiffness 01/01/2014  . Knee pain, left 01/01/2014  . Difficulty walking 01/01/2014  . S/P left TKA 12/08/2013    12:43 PM,09/08/16 Marylyn Ishihara PT, DPT [facility] [phone]  [facility] [address] [place], [place], [address] Phone: [phone]   Fax:  [phone]  Name: Teresa Charles MRN: [id number] Date of Birth: 1961-02-10

## 2016-09-11 ENCOUNTER — Ambulatory Visit (HOSPITAL_COMMUNITY): Payer: No Typology Code available for payment source | Admitting: Physical Therapy

## 2016-09-11 DIAGNOSIS — M6281 Muscle weakness (generalized): Secondary | ICD-10-CM

## 2016-09-11 DIAGNOSIS — M25661 Stiffness of right knee, not elsewhere classified: Secondary | ICD-10-CM | POA: Diagnosis not present

## 2016-09-11 DIAGNOSIS — R262 Difficulty in walking, not elsewhere classified: Secondary | ICD-10-CM

## 2016-09-11 DIAGNOSIS — R600 Localized edema: Secondary | ICD-10-CM

## 2016-09-11 NOTE — Therapy (Signed)
[place] [facility] [address] [place], [place], [address] Phone: [phone]   Fax:  [phone]  Physical Therapy Treatment  Patient Details  Name: Teresa Charles MRN: [id number] Date of Birth: [birth year] Referring Provider: Lanney GinsMatthew Babish   Encounter Date: 09/11/2016      PT End of Session - 09/11/16 1644    Visit Number 5   Number of Visits 19   Date for PT Re-Evaluation 09/19/16   Authorization Type [employer]    Authorization Time Period 08/29/16 to 10/11/16   PT Start Time 1605   PT Stop Time 1644   PT Time Calculation (min) 39 min   Activity Tolerance Patient tolerated treatment well   Behavior During Therapy [facility] for tasks assessed/performed      Past Medical History:  Diagnosis Date  . Anxiety   . Arthritis   . Depression   . Dysrhythmia    palpitations  . Hypothyroidism     Past Surgical History:  Procedure Laterality Date  . CARDIAC CATHETERIZATION  2009  . TOTAL KNEE ARTHROPLASTY Left 12/08/2013   Procedure: LEFT TOTAL KNEE ARTHROPLASTY;  Surgeon: Shelda PalMatthew D Olin, MD;  Location: [facility];  Service: Orthopedics;  Laterality: Left;  . TOTAL KNEE ARTHROPLASTY Right 08/28/2016   Procedure: RIGHT TOTAL KNEE ARTHROPLASTY;  Surgeon: Durene Romanslin, Matthew, MD;  Location: [facility];  Service: Orthopedics;  Laterality: Right;    There were no vitals filed for this visit.      Subjective Assessment - 09/11/16 1608    Subjective Patient arrives stating that she is feeling better, she has been working on extending her knee and she is still taking anti-virals for her shingles. She left her walker at home and entered clinic without device.    Pertinent History history L TKA, R TKA done 7/2, hypothyroidsm    Patient Stated Goals get back to work at the [employer]    Currently in Pain? Yes   Pain Score 5    Pain Location Knee   Pain Orientation Right                         [facility] Adult PT Treatment/Exercise - 09/11/16  0001      Ambulation/Gait   Ambulation Distance (Feet) 226 Feet   Assistive device None   Ambulation Surface Level;Indoor   Gait Comments reduced heel-toe and  TKE R knee, mild antalgic pattern      Knee/Hip Exercises: Stretches   Active Hamstring Stretch Right;3 reps;30 seconds   Active Hamstring Stretch Limitations 12 inch box    Knee: Self-Stretch Limitations 10x10 second holds    Theme park managerGastroc Stretch Both;3 reps;30 seconds   Gastroc Stretch Limitations slantboard      Knee/Hip Exercises: Standing   Terminal Knee Extension Limitations 15x3 second holds R LE    Other Standing Knee Exercises tandem stance 2x10 seconds on foam      Knee/Hip Exercises: Supine   Straight Leg Raises Right;15 reps   Straight Leg Raises Limitations ice R knee    Other Supine Knee/Hip Exercises supine HS stretch x4 minutes for knee extensions      Knee/Hip Exercises: Sidelying   Hip ABduction 1 set;15 reps;Both   Hip ABduction Limitations ice R knee      Knee/Hip Exercises: Prone   Hip Extension Right;1 set;15 reps   Hip Extension Limitations ice R knee      Manual Therapy   Manual Therapy Edema management;Joint mobilization  Manual therapy comments separate rest of session   Edema Management Gentle retrograde massage proximal to distal, LE elevated    Joint Mobilization patella mobility all directions grade III                 PT Education - 09/11/16 1643    Education provided Yes   Education Details possible HS tightness contributing to extenson lack    Person(s) Educated Patient   Methods Explanation   Comprehension Verbalized understanding          PT Short Term Goals - 08/29/16 1749      PT SHORT TERM GOAL #1   Title Patient to demonstrate R knee ROM as being 0-115 degrees in order to improve mechanics and reduce pain    Time 3   Period Weeks   Status New     PT SHORT TERM GOAL #2   Title Patient to be able to consistently ambulate with heel-toe pattern with LRAD, no  increase in pain and unlimited distances, in order to improve general access to home and community    Time 3   Period Weeks   Status New     PT SHORT TERM GOAL #3   Title Patient to experience pain R knee/LE as being no more than 5/10 in order to improve QOL and exercise tolerance    Time 3   Period Weeks   Status New     PT SHORT TERM GOAL #4   Title Patient to be compliant in correct performace of HEP, to be updated PRN    Time 1   Period Weeks   Status New           PT Long Term Goals - 08/29/16 1751      PT LONG TERM GOAL #1   Title Pateint to demonstrate functional strength as being 5/5 in all tested groups in order to improve mechanics and activity tolerance    Time 6   Period Weeks   Status New     PT LONG TERM GOAL #2   Title Patient to experience pain as being no more than 2/10 in order to improve QOL and facilitate return to work    Time 6   Period Weeks   Status New     PT LONG TERM GOAL #3   Title Patient to be able to perform on-feet tasks for at least 4 hours with no increase in pain in order to facilitate ability to return to work    Time 6   Period Weeks   Status New     PT LONG TERM GOAL #4   Title Patient to be able to ambulate with no device unlimited distances and will also be able to reciprocally ascend/descent full flight of stairs with U railing and good eccentric control in order to improve home and community access    Time 6   Period Weeks   Status New               Plan - 09/11/16 1644    Clinical Impression Statement Continued working on manual interventions today including patella mobility and edema control, otherwise focused on extension ROM today as well as general hip strengthening with ice pack applied. Practiced ambulation without assistive device with cues for heel-toe pattern, noting ongoing difficulty with heel-toe and TKE today. Patient remains motivated and participatory with skilled PT services moving forward.    Rehab  Potential Good   Clinical Impairments Affecting Rehab Potential (+)  success with PT for TKR in the past, motivated to participate, quick referral to rehab post-surgery   PT Frequency 3x / week   PT Duration 6 weeks   PT Treatment/Interventions ADLs/Self Care Home Management;Biofeedback;Cryotherapy;Moist Heat;DME Instruction;Gait training;Stair training;Functional mobility training;Therapeutic activities;Therapeutic exercise;Balance training;Neuromuscular re-education;Patient/family education;Manual techniques;Scar mobilization;Passive range of motion;Dry needling;Energy conservation   PT Next Visit Plan heavy focus on knee extension PROM; gait training with RW and SPC if able    PT Home Exercise Plan Eval: quad sets, heel slides, lateral weight shifts, knee flexion stretch, knee extension stretch    Consulted and Agree with Plan of Care Patient   Family Member Consulted spouse       Patient will benefit from skilled therapeutic intervention in order to improve the following deficits and impairments:  Abnormal gait, Decreased skin integrity, Increased fascial restricitons, Pain, Decreased coordination, Decreased mobility, Decreased scar mobility, Increased muscle spasms, Decreased activity tolerance, Decreased range of motion, Decreased strength, Hypomobility, Decreased balance, Difficulty walking, Increased edema, Impaired flexibility  Visit Diagnosis: Stiffness of right knee, not elsewhere classified  Localized edema  Muscle weakness (generalized)  Difficulty in walking, not elsewhere classified     Problem List Patient Active Problem List   Diagnosis Date Noted  . S/P right TKA 08/28/2016  . Primary osteoarthritis of left knee 01/01/2014  . Knee stiffness 01/01/2014  . Knee pain, left 01/01/2014  . Difficulty walking 01/01/2014  . S/P left TKA 12/08/2013    Nedra Hai PT, DPT [phone]  [facility] [address]  [place], [place], [address] Phone: [phone]   Fax:  [phone]  Name: LAWAN NANEZ MRN: [id number] Date of Birth: 12-Feb-1961

## 2016-09-13 ENCOUNTER — Ambulatory Visit (HOSPITAL_COMMUNITY): Payer: No Typology Code available for payment source

## 2016-09-13 DIAGNOSIS — M25661 Stiffness of right knee, not elsewhere classified: Secondary | ICD-10-CM

## 2016-09-13 DIAGNOSIS — R262 Difficulty in walking, not elsewhere classified: Secondary | ICD-10-CM

## 2016-09-13 DIAGNOSIS — R600 Localized edema: Secondary | ICD-10-CM

## 2016-09-13 DIAGNOSIS — M6281 Muscle weakness (generalized): Secondary | ICD-10-CM

## 2016-09-14 NOTE — Therapy (Signed)
[facility] [address] [place], [place], [address] Phone: [phone]   Fax:  [phone]  Physical Therapy Treatment  Patient Details  Name: Teresa Charles MRN: [id number] Date of Birth: 19-Jul-1960 Referring Provider: Lanney Gins   Encounter Date: 09/13/2016      PT End of Session - 09/13/16 1156    Visit Number 6   Number of Visits 19   Date for PT Re-Evaluation 09/19/16   Authorization Type [employer]    Authorization Time Period 08/29/16 to 10/11/16   PT Start Time 1118   PT Stop Time 1156   PT Time Calculation (min) 38 min   Activity Tolerance Patient tolerated treatment well   Behavior During Therapy [facility] for tasks assessed/performed      Past Medical History:  Diagnosis Date  . Anxiety   . Arthritis   . Depression   . Dysrhythmia    palpitations  . Hypothyroidism     Past Surgical History:  Procedure Laterality Date  . CARDIAC CATHETERIZATION  2009  . TOTAL KNEE ARTHROPLASTY Left 12/08/2013   Procedure: LEFT TOTAL KNEE ARTHROPLASTY;  Surgeon: Shelda Pal, MD;  Location: [facility];  Service: Orthopedics;  Laterality: Left;  . TOTAL KNEE ARTHROPLASTY Right 08/28/2016   Procedure: RIGHT TOTAL KNEE ARTHROPLASTY;  Surgeon: Durene Romans, MD;  Location: [facility];  Service: Orthopedics;  Laterality: Right;    There were no vitals filed for this visit.      Subjective Assessment - 09/13/16 1126    Subjective Pt arrived with SPC in Rt hand, reports her hip has been bothering her as well Rt knee, pain scale 4/10.   Pertinent History history L TKA, R TKA done 7/2, hypothyroidsm    Patient Stated Goals get back to work at the [employer]    Currently in Pain? Yes   Pain Score 4    Pain Location Leg  hip and knee   Pain Orientation Right   Pain Descriptors / Indicators Aching   Pain Type Surgical pain   Pain Onset Yesterday   Pain Frequency Constant   Aggravating Factors  being up on her feet   Pain Relieving  Factors ice   Effect of Pain on Daily Activities severe        09/13/16 0001  Ambulation/Gait  Ambulation Distance (Feet) 226 Feet  Assistive device Straight cane  Ambulation Surface Level;Indoor  Gait Comments educated proper hand use with SPC, cueing for heel to toe sequence and equal stance phase/stride length  Knee/Hip Exercises: Stretches  Active Hamstring Stretch Right;3 reps;30 seconds  Active Hamstring Stretch Limitations 12 inch box   Knee: Self-Stretch Limitations 10x10 second holds   Theme park manager Both;3 reps;30 seconds  Gastroc Stretch Limitations slantboard   Knee/Hip Exercises: Standing  Terminal Knee Extension Limitations 15x3 second holds R LE   Knee/Hip Exercises: Supine  Quad Sets Right;2 sets;15 reps  Short Arc Quad Sets Both;1 set;15 reps  Knee Flexion Limitations 98  Knee Extension Limitations 12  Knee/Hip Exercises: Prone  Prone Knee Hang 2 minutes  Prone Knee Hang Limitations manual to hamstrings  Manual Therapy  Manual Therapy Edema management;Joint mobilization  Manual therapy comments separate rest of session  Edema Management Gentle retrograde massage proximal to distal, LE elevated   Joint Mobilization patella mobility all directions grade III           PT Short Term Goals - 08/29/16 1749      PT SHORT TERM GOAL #1  Title Patient to demonstrate R knee ROM as being 0-115 degrees in order to improve mechanics and reduce pain    Time 3   Period Weeks   Status New     PT SHORT TERM GOAL #2   Title Patient to be able to consistently ambulate with heel-toe pattern with LRAD, no increase in pain and unlimited distances, in order to improve general access to home and community    Time 3   Period Weeks   Status New     PT SHORT TERM GOAL #3   Title Patient to experience pain R knee/LE as being no more than 5/10 in order to improve QOL and exercise tolerance    Time 3   Period Weeks   Status New     PT SHORT TERM GOAL #4   Title Patient  to be compliant in correct performace of HEP, to be updated PRN    Time 1   Period Weeks   Status New           PT Long Term Goals - 08/29/16 1751      PT LONG TERM GOAL #1   Title Pateint to demonstrate functional strength as being 5/5 in all tested groups in order to improve mechanics and activity tolerance    Time 6   Period Weeks   Status New     PT LONG TERM GOAL #2   Title Patient to experience pain as being no more than 2/10 in order to improve QOL and facilitate return to work    Time 6   Period Weeks   Status New     PT LONG TERM GOAL #3   Title Patient to be able to perform on-feet tasks for at least 4 hours with no increase in pain in order to facilitate ability to return to work    Time 6   Period Weeks   Status New     PT LONG TERM GOAL #4   Title Patient to be able to ambulate with no device unlimited distances and will also be able to reciprocally ascend/descent full flight of stairs with U railing and good eccentric control in order to improve home and community access    Time 6   Period Weeks   Status New               Plan - 09/13/16 1153    Clinical Impression Statement Pt entered dept with SPC.  Pt educated on proper hand use and sequence to improve gait mechanics.  Session focus on improving knee mobility primarly extension.  Began session with manual therapy to address edema present proximal anterior as well as posterior knee prior ROM based therex.  Added manual to hamstrings during prone knee hang.  AROM at EOS 12-98 degrees.  Reviewed compliance and form with HEP.     Rehab Potential Good   Clinical Impairments Affecting Rehab Potential (+) success with PT for TKR in the past, motivated to participate, quick referral to rehab post-surgery   PT Frequency 3x / week   PT Duration 6 weeks   PT Treatment/Interventions ADLs/Self Care Home Management;Biofeedback;Cryotherapy;Moist Heat;DME Instruction;Gait training;Stair training;Functional mobility  training;Therapeutic activities;Therapeutic exercise;Balance training;Neuromuscular re-education;Patient/family education;Manual techniques;Scar mobilization;Passive range of motion;Dry needling;Energy conservation   PT Next Visit Plan heavy focus on knee extension PROM; gait training with RW and SPC if able    PT Home Exercise Plan Eval: quad sets, heel slides, lateral weight shifts, knee flexion stretch, knee extension stretch  Patient will benefit from skilled therapeutic intervention in order to improve the following deficits and impairments:  Abnormal gait, Decreased skin integrity, Increased fascial restricitons, Pain, Decreased coordination, Decreased mobility, Decreased scar mobility, Increased muscle spasms, Decreased activity tolerance, Decreased range of motion, Decreased strength, Hypomobility, Decreased balance, Difficulty walking, Increased edema, Impaired flexibility  Visit Diagnosis: Stiffness of right knee, not elsewhere classified  Localized edema  Muscle weakness (generalized)  Difficulty in walking, not elsewhere classified     Problem List Patient Active Problem List   Diagnosis Date Noted  . S/P right TKA 08/28/2016  . Primary osteoarthritis of left knee 01/01/2014  . Knee stiffness 01/01/2014  . Knee pain, left 01/01/2014  . Difficulty walking 01/01/2014  . S/P left TKA 12/08/2013   Becky Sax, LPTA; CBIS [phone]  Juel Burrow 09/14/2016, 11:04 AM  [place] [facility] [address] [place], [place], [address] Phone: [phone]   Fax:  [phone]  Name: Teresa Charles MRN: [id number] Date of Birth: [birth year]

## 2016-09-15 ENCOUNTER — Ambulatory Visit (HOSPITAL_COMMUNITY): Payer: No Typology Code available for payment source | Admitting: Physical Therapy

## 2016-09-15 DIAGNOSIS — M6281 Muscle weakness (generalized): Secondary | ICD-10-CM

## 2016-09-15 DIAGNOSIS — M25661 Stiffness of right knee, not elsewhere classified: Secondary | ICD-10-CM

## 2016-09-15 DIAGNOSIS — R262 Difficulty in walking, not elsewhere classified: Secondary | ICD-10-CM

## 2016-09-15 DIAGNOSIS — R600 Localized edema: Secondary | ICD-10-CM

## 2016-09-15 NOTE — Therapy (Signed)
[facility] [address] [place], [place], [address] Phone: [phone]   Fax:  [phone]  Physical Therapy Treatment  Patient Details  Name: Teresa Charles MRN: [id number] Date of Birth: 1960-03-29 Referring Provider: Lanney Gins   Encounter Date: 09/15/2016      PT End of Session - 09/15/16 1549    Visit Number 7   Number of Visits 19   Date for PT Re-Evaluation 09/19/16   Authorization Type [employer]    Authorization Time Period 08/29/16 to 10/11/16   PT Start Time 1348   PT Stop Time 1426   PT Time Calculation (min) 38 min   Activity Tolerance Patient tolerated treatment well   Behavior During Therapy [facility] for tasks assessed/performed      Past Medical History:  Diagnosis Date  . Anxiety   . Arthritis   . Depression   . Dysrhythmia    palpitations  . Hypothyroidism     Past Surgical History:  Procedure Laterality Date  . CARDIAC CATHETERIZATION  2009  . TOTAL KNEE ARTHROPLASTY Left 12/08/2013   Procedure: LEFT TOTAL KNEE ARTHROPLASTY;  Surgeon: Shelda Pal, MD;  Location: [facility];  Service: Orthopedics;  Laterality: Left;  . TOTAL KNEE ARTHROPLASTY Right 08/28/2016   Procedure: RIGHT TOTAL KNEE ARTHROPLASTY;  Surgeon: Durene Romans, MD;  Location: [facility];  Service: Orthopedics;  Laterality: Right;    There were no vitals filed for this visit.      Subjective Assessment - 09/15/16 1350    Subjective patient arrives today with SPC, reports that her hip just has its times. She did some yardwork this morning and feels like she might have oeverdone it    Pertinent History history L TKA, R TKA done 7/2, hypothyroidsm    Patient Stated Goals get back to work at the [employer]    Currently in Pain? Yes   Pain Score 5    Pain Location Knee                         [facility] Adult PT Treatment/Exercise - 09/15/16 0001      Knee/Hip Exercises: Stretches   Active Hamstring Stretch Right;3 reps;30  seconds   Active Hamstring Stretch Limitations 12 inch box    Knee: Self-Stretch Limitations 15x10 seconds    Gastroc Stretch Both;3 reps;30 seconds   Gastroc Stretch Limitations slantboard      Knee/Hip Exercises: Standing   Terminal Knee Extension Limitations 15x3 second holds R LE      Knee/Hip Exercises: Prone   Prone Knee Hang 5 minutes   Prone Knee Hang Weights (lbs) 3     Manual Therapy   Manual Therapy Joint mobilization;Soft tissue mobilization   Manual therapy comments separate rest of session   Joint Mobilization patella mobility all directions grade III    Soft tissue mobilization HS and gastroc STM in prone              Balance Exercises - 09/15/16 1420      Balance Exercises: Standing   Tandem Stance Eyes open;Foam/compliant surface;3 reps;15 secs   SLS Eyes open;3 reps;Solid surface;Modified           PT Education - 09/15/16 1548    Education provided Yes   Education Details safe participation with joint overpressure to prevent muscle tear    Person(s) Educated Patient   Methods Explanation   Comprehension Need further instruction  PT Short Term Goals - 08/29/16 1749      PT SHORT TERM GOAL #1   Title Patient to demonstrate R knee ROM as being 0-115 degrees in order to improve mechanics and reduce pain    Time 3   Period Weeks   Status New     PT SHORT TERM GOAL #2   Title Patient to be able to consistently ambulate with heel-toe pattern with LRAD, no increase in pain and unlimited distances, in order to improve general access to home and community    Time 3   Period Weeks   Status New     PT SHORT TERM GOAL #3   Title Patient to experience pain R knee/LE as being no more than 5/10 in order to improve QOL and exercise tolerance    Time 3   Period Weeks   Status New     PT SHORT TERM GOAL #4   Title Patient to be compliant in correct performace of HEP, to be updated PRN    Time 1   Period Weeks   Status New            PT Long Term Goals - 08/29/16 1751      PT LONG TERM GOAL #1   Title Pateint to demonstrate functional strength as being 5/5 in all tested groups in order to improve mechanics and activity tolerance    Time 6   Period Weeks   Status New     PT LONG TERM GOAL #2   Title Patient to experience pain as being no more than 2/10 in order to improve QOL and facilitate return to work    Time 6   Period Weeks   Status New     PT LONG TERM GOAL #3   Title Patient to be able to perform on-feet tasks for at least 4 hours with no increase in pain in order to facilitate ability to return to work    Time 6   Period Weeks   Status New     PT LONG TERM GOAL #4   Title Patient to be able to ambulate with no device unlimited distances and will also be able to reciprocally ascend/descent full flight of stairs with U railing and good eccentric control in order to improve home and community access    Time 6   Period Weeks   Status New               Plan - 09/15/16 1550    Clinical Impression Statement Patient arrives stating she is hurting today, she did a lot in the yard this morning and overdid it. Started session with functional stretches and mobility exercises for warmup, then attempted knee extension overpressure however patient resistant and not allowing DPT to correctly/safely perform exercise.  Patient continues to report posterior R LE sore, note residual bruising but no redness/discoloration, heat, or edema of concern. Performed STM to R HS and gastroc musculature followed by prone knee hang and ended session with balance training.    Rehab Potential Good   Clinical Impairments Affecting Rehab Potential (+) success with PT for TKR in the past, motivated to participate, quick referral to rehab post-surgery   PT Frequency 3x / week   PT Duration 6 weeks   PT Treatment/Interventions ADLs/Self Care Home Management;Biofeedback;Cryotherapy;Moist Heat;DME Instruction;Gait training;Stair  training;Functional mobility training;Therapeutic activities;Therapeutic exercise;Balance training;Neuromuscular re-education;Patient/family education;Manual techniques;Scar mobilization;Passive range of motion;Dry needling;Energy conservation   PT Next Visit Plan continue with heavy focus  on extension and flexion ROM. Balance.    PT Home Exercise Plan Eval: quad sets, heel slides, lateral weight shifts, knee flexion stretch, knee extension stretch    Consulted and Agree with Plan of Care Patient      Patient will benefit from skilled therapeutic intervention in order to improve the following deficits and impairments:  Abnormal gait, Decreased skin integrity, Increased fascial restricitons, Pain, Decreased coordination, Decreased mobility, Decreased scar mobility, Increased muscle spasms, Decreased activity tolerance, Decreased range of motion, Decreased strength, Hypomobility, Decreased balance, Difficulty walking, Increased edema, Impaired flexibility  Visit Diagnosis: Stiffness of right knee, not elsewhere classified  Localized edema  Muscle weakness (generalized)  Difficulty in walking, not elsewhere classified     Problem List Patient Active Problem List   Diagnosis Date Noted  . S/P right TKA 08/28/2016  . Primary osteoarthritis of left knee 01/01/2014  . Knee stiffness 01/01/2014  . Knee pain, left 01/01/2014  . Difficulty walking 01/01/2014  . S/P left TKA 12/08/2013    Nedra HaiKristen Tymira Horkey PT, DPT [phone]  [facility] [address] [place], [place], [address] Phone: [phone]   Fax:  [phone]  Name: Teresa Charles MRN: [id number] Date of Birth: 1961/02/06

## 2016-09-15 NOTE — Patient Instructions (Signed)
     PRONE KNEE HANGS  While lying down on your stomach, allow your leg to hang off the end of a table/bed. Position yourself so that your knee cap is just over the end of the table/bed.   Just relax your body and allow gravity to stretch your knee into a more straightened position.  Add an ankle weight for increased stretch.  Start by holding for 2-3 minutes; add time as you are able to tolerate.   Repeat 2 times per day.

## 2016-09-18 ENCOUNTER — Ambulatory Visit (HOSPITAL_COMMUNITY): Payer: No Typology Code available for payment source | Admitting: Physical Therapy

## 2016-09-18 DIAGNOSIS — M25661 Stiffness of right knee, not elsewhere classified: Secondary | ICD-10-CM

## 2016-09-18 DIAGNOSIS — R600 Localized edema: Secondary | ICD-10-CM

## 2016-09-18 DIAGNOSIS — M6281 Muscle weakness (generalized): Secondary | ICD-10-CM

## 2016-09-18 DIAGNOSIS — R262 Difficulty in walking, not elsewhere classified: Secondary | ICD-10-CM

## 2016-09-18 NOTE — Therapy (Signed)
[facility] [address] [place], [place], [address] Phone: [phone]   Fax:  [phone]  Physical Therapy Treatment  Patient Details  Name: Teresa Charles MRN: [id number] Date of Birth: [birth year] Referring Provider: Danae Orleans   Encounter Date: 09/18/2016      PT End of Session - 09/18/16 1423    Visit Number 8   Number of Visits 19   Date for PT Re-Evaluation 09/19/16   Authorization Type [employer]    Authorization Time Period 08/29/16 to 10/11/16   PT Start Time 1301   PT Stop Time 1345   PT Time Calculation (min) 44 min   Activity Tolerance Patient tolerated treatment well   Behavior During Therapy [facility] for tasks assessed/performed      Past Medical History:  Diagnosis Date  . Anxiety   . Arthritis   . Depression   . Dysrhythmia    palpitations  . Hypothyroidism     Past Surgical History:  Procedure Laterality Date  . CARDIAC CATHETERIZATION  2009  . TOTAL KNEE ARTHROPLASTY Left 12/08/2013   Procedure: LEFT TOTAL KNEE ARTHROPLASTY;  Surgeon: Mauri Pole, MD;  Location: [facility];  Service: Orthopedics;  Laterality: Left;  . TOTAL KNEE ARTHROPLASTY Right 08/28/2016   Procedure: RIGHT TOTAL KNEE ARTHROPLASTY;  Surgeon: Paralee Cancel, MD;  Location: [facility];  Service: Orthopedics;  Laterality: Right;    There were no vitals filed for this visit.      Subjective Assessment - 09/18/16 1302    Subjective Pt reports that she had a bad weekend because she just felt really weak. She was outside walking in her yard with the cane. She thought she might fall, so she just went inside the house. She says that she is not putting any weight on her knee when stretching it.    Pertinent History history L TKA, R TKA done 7/2, hypothyroidsm    Patient Stated Goals get back to work at the [employer]    Currently in Pain? Yes   Pain Score 4    Pain Location Knee   Pain Orientation Right   Pain Descriptors / Indicators Aching    Pain Type Surgical pain   Pain Radiating Towards none    Pain Onset 1 to 4 weeks ago   Pain Frequency Constant   Aggravating Factors  being up on her feet too much    Pain Relieving Factors ice and pain medication             [facility] PT Assessment - 09/18/16 0001      Assessment   Medical Diagnosis R TKR    Onset Date/Surgical Date 08/28/16   Next MD Visit unsure    Prior Therapy PT for her L knee 3 years ago      Precautions   Precautions Fall     Prior Function   Level of Independence Independent;Independent with basic ADLs;Independent with gait;Independent with transfers   Vocation Full time employment   Designer, fashion/clothing    Leisure outside activities, gardneing      AROM   Right Knee Extension 24   Right Knee Flexion 103     Strength   Right Hip Flexion 3-/5  pain limited    Right Hip Extension 3-/5   Right Hip ABduction 4/5   Left Hip Flexion 3/5   Left Hip Extension 3/5   Left Hip ABduction 4-/5   Right Knee Flexion 3-/5  ROM  limited    Right Knee Extension 3-/5  ROM limited    Left Knee Flexion 5/5   Left Knee Extension 5/5   Right Ankle Dorsiflexion 5/5   Left Ankle Dorsiflexion 5/5     Transfers   Five time sit to stand comments  12.52, no UE weight shifted Lt.                      [facility] Adult PT Treatment/Exercise - 09/18/16 0001      Ambulation/Gait   Gait Comments antalgic pattern, reduced flexion/extension R knee, reduced stance time R knee; x315f with SPC initially to improve step length and heel strike. Use of mirror feedback to improve upright posture and step length without AD.      Knee/Hip Exercises: Stretches   Passive Hamstring Stretch Right;3 reps;30 seconds   Passive Hamstring Stretch Limitations 12" box with over pressure     Knee/Hip Exercises: Standing   Terminal Knee Extension Limitations 10x3", double blue TB      Knee/Hip Exercises: Seated   Other Seated Knee/Hip Exercises Rt knee  flexion stretch 10x10" hold      Knee/Hip Exercises: Supine   Other Supine Knee/Hip Exercises knee extension stretch 3x2' at a time, 3# weight      Manual Therapy   Manual therapy comments Separate rest of session   Joint Mobilization Grade III/IV anterior tibiofemoral open chain jt mobs, Rt only; Grade III/IV AB proximal fib mobs                 PT Education - 09/18/16 1411    Education provided Yes   Education Details reiterated the importance of adhering to HEP to address significant deficits in knee extension ROM; importance of ambulating with AD until gait mechanics improve   Person(s) Educated Patient   Methods Explanation;Verbal cues   Comprehension Verbalized understanding;Returned demonstration          PT Short Term Goals - 09/18/16 1326      PT SHORT TERM GOAL #1   Title Patient to demonstrate R knee ROM as being 0-115 degrees in order to improve mechanics and reduce pain    Baseline extension ~25 deg lacking, flexion 103 deg    Time 3   Period Weeks   Status Partially Met     PT SHORT TERM GOAL #2   Title Patient to be able to consistently ambulate with heel-toe pattern with LRAD, no increase in pain and unlimited distances, in order to improve general access to home and community    Baseline walking without AD, initial contact on toe, antalgic pattern noted    Time 3   Period Weeks   Status Not Met     PT SHORT TERM GOAL #3   Title Patient to experience pain R knee/LE as being no more than 5/10 in order to improve QOL and exercise tolerance    Baseline with pain medication    Time 3   Period Weeks   Status Partially Met     PT SHORT TERM GOAL #4   Title Patient to be compliant in correct performace of HEP, to be updated PRN    Baseline pt not consistently adhering to HEP   Time 1   Period Weeks   Status Partially Met           PT Long Term Goals - 08/29/16 1751      PT LONG TERM GOAL #1   Title Pateint to demonstrate functional  strength  as being 5/5 in all tested groups in order to improve mechanics and activity tolerance    Time 6   Period Weeks   Status New     PT LONG TERM GOAL #2   Title Patient to experience pain as being no more than 2/10 in order to improve QOL and facilitate return to work    Time 6   Period Weeks   Status New     PT LONG TERM GOAL #3   Title Patient to be able to perform on-feet tasks for at least 4 hours with no increase in pain in order to facilitate ability to return to work    Time 6   Period Weeks   Status New     PT LONG TERM GOAL #4   Title Patient to be able to ambulate with no device unlimited distances and will also be able to reciprocally ascend/descent full flight of stairs with U railing and good eccentric control in order to improve home and community access    Time 6   Period Weeks   Status New               Plan - 09/18/16 1502    Clinical Impression Statement Pt continues to only demonstrate fair HEP adherence at home. She arrived today ambulating without an AD, demonstrating antalgic pattern and poor heel strike. Therapist spent a port of the session addressing proper gait mechanics and educating pt on the importance of using her AD at home and in the community until her mechanics improve. Continued with manual techniques and therex to improve her knee ROM, however pt continues to have limited knee extension. Reiterated the importance of HEP adherence and pt verbalized understanding.    Rehab Potential Good   Clinical Impairments Affecting Rehab Potential (+) success with PT for TKR in the past, motivated to participate, quick referral to rehab post-surgery   PT Frequency 3x / week   PT Duration 6 weeks   PT Treatment/Interventions ADLs/Self Care Home Management;Biofeedback;Cryotherapy;Moist Heat;DME Instruction;Gait training;Stair training;Functional mobility training;Therapeutic activities;Therapeutic exercise;Balance training;Neuromuscular  re-education;Patient/family education;Manual techniques;Scar mobilization;Passive range of motion;Dry needling;Energy conservation   PT Next Visit Plan knee extension ROM, scar massage, continue to address gait mechanics    PT Home Exercise Plan Eval: quad sets, heel slides, lateral weight shifts, knee flexion stretch, knee extension stretch    Consulted and Agree with Plan of Care Patient      Patient will benefit from skilled therapeutic intervention in order to improve the following deficits and impairments:  Abnormal gait, Decreased skin integrity, Increased fascial restricitons, Pain, Decreased coordination, Decreased mobility, Decreased scar mobility, Increased muscle spasms, Decreased activity tolerance, Decreased range of motion, Decreased strength, Hypomobility, Decreased balance, Difficulty walking, Increased edema, Impaired flexibility  Visit Diagnosis: Stiffness of right knee, not elsewhere classified  Localized edema  Muscle weakness (generalized)  Difficulty in walking, not elsewhere classified     Problem List Patient Active Problem List   Diagnosis Date Noted  . S/P right TKA 08/28/2016  . Primary osteoarthritis of left knee 01/01/2014  . Knee stiffness 01/01/2014  . Knee pain, left 01/01/2014  . Difficulty walking 01/01/2014  . S/P left TKA 12/08/2013     3:28 PM,09/18/16 Elly Modena PT, DPT [facility] [address] [place], [place], [address] Phone: [phone]   Fax:  [phone]  Name: Teresa Charles MRN: [id number] Date of Birth: Apr 15, 1960

## 2016-09-20 ENCOUNTER — Ambulatory Visit (HOSPITAL_COMMUNITY): Payer: No Typology Code available for payment source | Admitting: Physical Therapy

## 2016-09-20 DIAGNOSIS — R600 Localized edema: Secondary | ICD-10-CM

## 2016-09-20 DIAGNOSIS — M6281 Muscle weakness (generalized): Secondary | ICD-10-CM

## 2016-09-20 DIAGNOSIS — M25661 Stiffness of right knee, not elsewhere classified: Secondary | ICD-10-CM

## 2016-09-20 DIAGNOSIS — R262 Difficulty in walking, not elsewhere classified: Secondary | ICD-10-CM

## 2016-09-20 NOTE — Therapy (Signed)
[place], [place], [address] Phone: [phone]   Fax:  [phone]  Physical Therapy Treatment (Re-Assessment)  Patient Details  Name: Teresa Charles MRN: [id number] Date of Birth: 10/31/1960 Referring Provider: Danae Orleans   Encounter Date: 09/20/2016      PT End of Session - 09/20/16 1614    Visit Number 9   Number of Visits 19   Date for PT Re-Evaluation 10/11/16   Authorization Type [employer]    Authorization Time Period 08/29/16 to 10/11/16   PT Start Time 1432   PT Stop Time 1511   PT Time Calculation (min) 39 min   Activity Tolerance Patient tolerated treatment well   Behavior During Therapy [facility] for tasks assessed/performed      Past Medical History:  Diagnosis Date  . Anxiety   . Arthritis   . Depression   . Dysrhythmia    palpitations  . Hypothyroidism     Past Surgical History:  Procedure Laterality Date  . CARDIAC CATHETERIZATION  2009  . TOTAL KNEE ARTHROPLASTY Left 12/08/2013   Procedure: LEFT TOTAL KNEE ARTHROPLASTY;  Surgeon: Mauri Pole, MD;  Location: [facility];  Service: Orthopedics;  Laterality: Left;  . TOTAL KNEE ARTHROPLASTY Right 08/28/2016   Procedure: RIGHT TOTAL KNEE ARTHROPLASTY;  Surgeon: Paralee Cancel, MD;  Location: [facility];  Service: Orthopedics;  Laterality: Right;    There were no vitals filed for this visit.      Subjective Assessment - 09/20/16 1435    Subjective Patient arrives stating that she pushed herself since she left here the other day. She is really tired today. She reports she just has to be careful thorugh her day, it is harder to get in and out of shower.    Pertinent History history L TKA, R TKA done 7/2, hypothyroidsm    How long can you sit comfortably? 7/25- fairly unlimited    How long can you stand comfortably? 7/25- if she offshifts to L ok; wieght on R has immediate discomfort    How long can you walk comfortably? 7/25- with walker, laps  in [place]; without walker she continues to have pain    Patient Stated Goals get back to work at the [employer]    Currently in Pain? Yes   Pain Score 4    Pain Location Knee   Pain Orientation Right            [facility] PT Assessment - 09/20/16 0001      AROM   Right Knee Extension 21  able to reach 7 degrees with overpressure    Right Knee Flexion 105     Strength   Right Hip Flexion 4+/5   Right Hip ABduction 5/5   Left Hip Flexion 4+/5   Left Hip ABduction 4+/5   Right Knee Flexion 5/5   Right Knee Extension 5/5   Left Knee Flexion 5/5   Left Knee Extension 5/5   Right Ankle Dorsiflexion 5/5   Left Ankle Dorsiflexion 5/5     6 minute walk test results    Aerobic Endurance Distance Walked 606   Endurance additional comments 3MWT walker      High Level Balance   High Level Balance Comments 7.8 no device                      [facility] Adult PT Treatment/Exercise - 09/20/16 0001      Manual Therapy  Manual Therapy Other (comment);Joint mobilization   Joint Mobilization patella mobility    Other Manual Therapy overpressure for knee extension in supine                 PT Education - 09/20/16 1612    Education provided Yes   Education Details progress with skilled PT services, POC; importance of HEP compliance, regular icing and elevation, impact on knee extension ROM on gait and mobility, importance of reaching full knee extension/ROM    Person(s) Educated Patient   Methods Explanation   Comprehension Verbalized understanding          PT Short Term Goals - 09/20/16 1452      PT SHORT TERM GOAL #1   Title Patient to demonstrate R knee ROM as being 0-115 degrees in order to improve mechanics and reduce pain    Baseline 7/25- 21-105    Time 3   Period Weeks   Status On-going     PT SHORT TERM GOAL #2   Title Patient to be able to consistently ambulate with heel-toe pattern with LRAD, no increase in pain and unlimited distances, in order  to improve general access to home and community    Baseline 7/25- ongiong difficulty    Time 3   Period Weeks   Status On-going     PT SHORT TERM GOAL #3   Title Patient to experience pain R knee/LE as being no more than 5/10 in order to improve QOL and exercise tolerance    Baseline 7/25- without pain medicine 6/10    Time 3   Period Weeks   Status On-going     PT SHORT TERM GOAL #4   Title Patient to be compliant in correct performace of HEP, to be updated PRN    Baseline 7/25- mixed compliance    Time 1   Period Weeks   Status On-going           PT Long Term Goals - 09/20/16 1454      PT LONG TERM GOAL #1   Title Pateint to demonstrate functional strength as being 5/5 in all tested groups in order to improve mechanics and activity tolerance    Baseline 7/25- sterngth has really improved    Time 6   Period Weeks   Status Partially Met     PT LONG TERM GOAL #2   Title Patient to experience pain as being no more than 2/10 in order to improve QOL and facilitate return to work    Baseline 7/25- 6/10   Time 6   Period Weeks   Status On-going     PT LONG TERM GOAL #3   Title Patient to be able to perform on-feet tasks for at least 4 hours with no increase in pain in order to facilitate ability to return to work    Baseline 7/25- unable    Time 6   Period Weeks   Status On-going     PT LONG TERM GOAL #4   Title Patient to be able to ambulate with no device unlimited distances and will also be able to reciprocally ascend/descent full flight of stairs with U railing and good eccentric control in order to improve home and community access    Baseline 7/25- ongoing    Time 6   Period Weeks   Status On-going               Plan - 09/20/16 1614    Clinical Impression Statement Re-assessment  performed today. Patient shows improvement in functional balance and strength however continues to demonstrate significant limitation in ROM, gait mechanics, and edema control.  Provided extensive education regarding importance of full AROM of knee to eliminate limp and improve gait and function, stressed compliance with HEP and extension based exercises at home as well. Recommend extension of skilled PT services to continue to further aggressively focus on ROM and related impairment moving forward.    Rehab Potential Good   Clinical Impairments Affecting Rehab Potential (+) success with PT for TKR in the past, motivated to participate, quick referral to rehab post-surgery   PT Frequency 3x / week   PT Duration 3 weeks   PT Treatment/Interventions ADLs/Self Care Home Management;Biofeedback;Cryotherapy;Moist Heat;DME Instruction;Gait training;Stair training;Functional mobility training;Therapeutic activities;Therapeutic exercise;Balance training;Neuromuscular re-education;Patient/family education;Manual techniques;Scar mobilization;Passive range of motion;Dry needling;Energy conservation   PT Next Visit Plan focus on knee extension ROM aggressively, aggressive manual    PT Home Exercise Plan Eval: quad sets, heel slides, lateral weight shifts, knee flexion stretch, knee extension stretch    Consulted and Agree with Plan of Care Patient      Patient will benefit from skilled therapeutic intervention in order to improve the following deficits and impairments:  Abnormal gait, Decreased skin integrity, Increased fascial restricitons, Pain, Decreased coordination, Decreased mobility, Decreased scar mobility, Increased muscle spasms, Decreased activity tolerance, Decreased range of motion, Decreased strength, Hypomobility, Decreased balance, Difficulty walking, Increased edema, Impaired flexibility  Visit Diagnosis: Stiffness of right knee, not elsewhere classified  Localized edema  Muscle weakness (generalized)  Difficulty in walking, not elsewhere classified     Problem List Patient Active Problem List   Diagnosis Date Noted  . S/P right TKA 08/28/2016  . Primary  osteoarthritis of left knee 01/01/2014  . Knee stiffness 01/01/2014  . Knee pain, left 01/01/2014  . Difficulty walking 01/01/2014  . S/P left TKA 12/08/2013    Deniece Ree PT, DPT [place] [address] [place], [place], [address] Phone: [phone]   Fax:  [phone]  Name: Teresa Charles MRN: [id number] Date of Birth: [birth year]

## 2016-09-22 ENCOUNTER — Ambulatory Visit (HOSPITAL_COMMUNITY): Payer: No Typology Code available for payment source

## 2016-09-22 DIAGNOSIS — R262 Difficulty in walking, not elsewhere classified: Secondary | ICD-10-CM

## 2016-09-22 DIAGNOSIS — M25661 Stiffness of right knee, not elsewhere classified: Secondary | ICD-10-CM | POA: Diagnosis not present

## 2016-09-22 DIAGNOSIS — R600 Localized edema: Secondary | ICD-10-CM

## 2016-09-22 DIAGNOSIS — M6281 Muscle weakness (generalized): Secondary | ICD-10-CM

## 2016-09-22 NOTE — Therapy (Signed)
[place] [address] [place], [place], [address] Phone: [phone]   Fax:  [phone]  Physical Therapy Treatment  Patient Details  Name: Teresa Charles MRN: [id number] Date of Birth: 01/01/1961 Referring Provider: Danae Orleans   Encounter Date: 09/22/2016      PT End of Session - 09/22/16 1356    Visit Number 10   Number of Visits 19   Date for PT Re-Evaluation 10/11/16   Authorization Type [employer]    Authorization Time Period 08/29/16 to 10/11/16   PT Start Time 1351   PT Stop Time 1432   PT Time Calculation (min) 41 min   Activity Tolerance Patient tolerated treatment well;No increased pain   Behavior During Therapy WFL for tasks assessed/performed      Past Medical History:  Diagnosis Date  . Anxiety   . Arthritis   . Depression   . Dysrhythmia    palpitations  . Hypothyroidism     Past Surgical History:  Procedure Laterality Date  . CARDIAC CATHETERIZATION  2009  . TOTAL KNEE ARTHROPLASTY Left 12/08/2013   Procedure: LEFT TOTAL KNEE ARTHROPLASTY;  Surgeon: Mauri Pole, MD;  Location: [facility];  Service: Orthopedics;  Laterality: Left;  . TOTAL KNEE ARTHROPLASTY Right 08/28/2016   Procedure: RIGHT TOTAL KNEE ARTHROPLASTY;  Surgeon: Paralee Cancel, MD;  Location: [facility];  Service: Orthopedics;  Laterality: Right;    There were no vitals filed for this visit.      Subjective Assessment - 09/22/16 1353    Subjective Pt reports she continues to be limited by pain, pain scale 5/10 Rt knee with pain medication prior session.  Reports cramping in hip musculature and ankles.   Pertinent History history L TKA, R TKA done 7/2, hypothyroidsm    Patient Stated Goals get back to work at the [employer]    Currently in Pain? Yes   Pain Score 5    Pain Location Knee   Pain Orientation Right   Pain Descriptors / Indicators Aching;Sore;Tightness   Pain Type Surgical pain   Pain Onset 1 to 4 weeks ago   Pain  Frequency Constant   Aggravating Factors  being up on her feet too much   Pain Relieving Factors ice and pain medication   Effect of Pain on Daily Activities severe                         [facility] Adult PT Treatment/Exercise - 09/22/16 0001      Knee/Hip Exercises: Stretches   Active Hamstring Stretch Right;3 reps;30 seconds   Active Hamstring Stretch Limitations supine with rope   Gastroc Stretch Both;3 reps;30 seconds   Gastroc Stretch Limitations slantboard      Knee/Hip Exercises: Standing   Terminal Knee Extension Limitations 10x3", double blue TB      Knee/Hip Exercises: Seated   Long Arc Quad Right;1 set;15 reps     Knee/Hip Exercises: Supine   Short Arc Quad Sets Both;1 set;15 reps   Short Arc Quad Sets Limitations 5" holds   Knee Extension AROM   Knee Extension Limitations 15 degree extension   Knee Flexion Limitations 105 flexion      Manual Therapy   Manual Therapy Joint mobilization;Myofascial release;Passive ROM   Manual therapy comments Separate rest of session   Joint Mobilization patella mobility and tib/fib   Soft tissue mobilization HS and gastroc STM in prone; quad and ITB in supine   Myofascial  Release scar tissue massage   Passive ROM extension 5x 30" holds supine   Other Manual Therapy overpressure for knee extension in supine                   PT Short Term Goals - 09/20/16 1452      PT SHORT TERM GOAL #1   Title Patient to demonstrate R knee ROM as being 0-115 degrees in order to improve mechanics and reduce pain    Baseline 7/25- 21-105    Time 3   Period Weeks   Status On-going     PT SHORT TERM GOAL #2   Title Patient to be able to consistently ambulate with heel-toe pattern with LRAD, no increase in pain and unlimited distances, in order to improve general access to home and community    Baseline 7/25- ongiong difficulty    Time 3   Period Weeks   Status On-going     PT SHORT TERM GOAL #3   Title Patient to  experience pain R knee/LE as being no more than 5/10 in order to improve QOL and exercise tolerance    Baseline 7/25- without pain medicine 6/10    Time 3   Period Weeks   Status On-going     PT SHORT TERM GOAL #4   Title Patient to be compliant in correct performace of HEP, to be updated PRN    Baseline 7/25- mixed compliance    Time 1   Period Weeks   Status On-going           PT Long Term Goals - 09/20/16 1454      PT LONG TERM GOAL #1   Title Pateint to demonstrate functional strength as being 5/5 in all tested groups in order to improve mechanics and activity tolerance    Baseline 7/25- sterngth has really improved    Time 6   Period Weeks   Status Partially Met     PT LONG TERM GOAL #2   Title Patient to experience pain as being no more than 2/10 in order to improve QOL and facilitate return to work    Baseline 7/25- 6/10   Time 6   Period Weeks   Status On-going     PT LONG TERM GOAL #3   Title Patient to be able to perform on-feet tasks for at least 4 hours with no increase in pain in order to facilitate ability to return to work    Baseline 7/25- unable    Time 6   Period Weeks   Status On-going     PT LONG TERM GOAL #4   Title Patient to be able to ambulate with no device unlimited distances and will also be able to reciprocally ascend/descent full flight of stairs with U railing and good eccentric control in order to improve home and community access    Baseline 7/25- ongoing    Time 6   Period Weeks   Status On-going               Plan - 09/22/16 1432    Clinical Impression Statement Session focus addressing extension limitations with ROM.  Included manual technqiues to address musculature restricitons, PROM per pt tolerance, functional strengthening and stretches.  Noted multiple spams/tightness with hamstrings, gastroc and ITB with joint restrictions with tib/fib and patella mobility.  EOS improved AROM 15-105 degrees,   Rehab Potential Good    Clinical Impairments Affecting Rehab Potential (+) success with PT for TKR in  the past, motivated to participate, quick referral to rehab post-surgery   PT Frequency 3x / week   PT Duration 3 weeks   PT Treatment/Interventions ADLs/Self Care Home Management;Biofeedback;Cryotherapy;Moist Heat;DME Instruction;Gait training;Stair training;Functional mobility training;Therapeutic activities;Therapeutic exercise;Balance training;Neuromuscular re-education;Patient/family education;Manual techniques;Scar mobilization;Passive range of motion;Dry needling;Energy conservation   PT Next Visit Plan focus on knee extension ROM aggressively, aggressive manual    PT Home Exercise Plan Eval: quad sets, heel slides, lateral weight shifts, knee flexion stretch, knee extension stretch       Patient will benefit from skilled therapeutic intervention in order to improve the following deficits and impairments:  Abnormal gait, Decreased skin integrity, Increased fascial restricitons, Pain, Decreased coordination, Decreased mobility, Decreased scar mobility, Increased muscle spasms, Decreased activity tolerance, Decreased range of motion, Decreased strength, Hypomobility, Decreased balance, Difficulty walking, Increased edema, Impaired flexibility  Visit Diagnosis: Stiffness of right knee, not elsewhere classified  Muscle weakness (generalized)  Difficulty in walking, not elsewhere classified  Localized edema     Problem List Patient Active Problem List   Diagnosis Date Noted  . S/P right TKA 08/28/2016  . Primary osteoarthritis of left knee 01/01/2014  . Knee stiffness 01/01/2014  . Knee pain, left 01/01/2014  . Difficulty walking 01/01/2014  . S/P left TKA 12/08/2013   Ihor Austin, [place]; [facility] [phone]'  Aldona Lento 09/22/2016, [place] [address] [place], [place], [address] Phone: [phone]   Fax:  [phone]  Name: DARE SPILLMAN MRN: [id number] Date of Birth: 04-06-1960

## 2016-09-25 ENCOUNTER — Ambulatory Visit (HOSPITAL_COMMUNITY): Payer: No Typology Code available for payment source

## 2016-09-25 DIAGNOSIS — M6281 Muscle weakness (generalized): Secondary | ICD-10-CM

## 2016-09-25 DIAGNOSIS — R262 Difficulty in walking, not elsewhere classified: Secondary | ICD-10-CM

## 2016-09-25 DIAGNOSIS — M25661 Stiffness of right knee, not elsewhere classified: Secondary | ICD-10-CM | POA: Diagnosis not present

## 2016-09-25 DIAGNOSIS — R600 Localized edema: Secondary | ICD-10-CM

## 2016-09-25 NOTE — Therapy (Signed)
[place], [place], [address] Phone: [phone]   Fax:  [phone]  Physical Therapy Treatment  Patient Details  Name: Teresa Charles MRN: [id number] Date of Birth: 05-20-1960 Referring Provider: Danae Orleans   Encounter Date: 09/25/2016      PT End of Session - 09/25/16 1439    Visit Number 11   Number of Visits 19   Date for PT Re-Evaluation 10/11/16   Authorization Type [employer]    Authorization Time Period 08/29/16 to 10/11/16   PT Start Time 1436   PT Stop Time 1515   PT Time Calculation (min) 39 min   Activity Tolerance Patient tolerated treatment well;No increased pain   Behavior During Therapy WFL for tasks assessed/performed      Past Medical History:  Diagnosis Date  . Anxiety   . Arthritis   . Depression   . Dysrhythmia    palpitations  . Hypothyroidism     Past Surgical History:  Procedure Laterality Date  . CARDIAC CATHETERIZATION  2009  . TOTAL KNEE ARTHROPLASTY Left 12/08/2013   Procedure: LEFT TOTAL KNEE ARTHROPLASTY;  Surgeon: Mauri Pole, MD;  Location: [facility];  Service: Orthopedics;  Laterality: Left;  . TOTAL KNEE ARTHROPLASTY Right 08/28/2016   Procedure: RIGHT TOTAL KNEE ARTHROPLASTY;  Surgeon: Paralee Cancel, MD;  Location: [facility];  Service: Orthopedics;  Laterality: Right;    There were no vitals filed for this visit.      Subjective Assessment - 09/25/16 1437    Subjective Pt entered dept with no AD.  Left her RW behind and reports LE tangled up when walking with SPC.  Reports pain scale 4/10   Pertinent History history L TKA, R TKA done 7/2, hypothyroidsm    Patient Stated Goals get back to work at the [employer]    Currently in Pain? Yes   Pain Score 4    Pain Location Knee   Pain Orientation Right   Pain Descriptors / Indicators Aching;Dull   Pain Type Surgical pain   Pain Onset 1 to 4 weeks ago   Pain Frequency Constant   Aggravating Factors  being up  on her feet too much   Pain Relieving Factors ice and pain medication   Effect of Pain on Daily Activities severe                         [facility] Adult PT Treatment/Exercise - 09/25/16 0001      Ambulation/Gait   Ambulation Distance (Feet) 226 Feet   Assistive device None   Ambulation Surface Level;Indoor   Gait Comments antalgic pattern, reduced flexion/extension R knee, reduced stance time R knee;  Cueing to improve heel strike to assist with extension     Knee/Hip Exercises: Stretches   Active Hamstring Stretch Right;3 reps;30 seconds   Active Hamstring Stretch Limitations supine with rope   Gastroc Stretch Both;3 reps;30 seconds   Gastroc Stretch Limitations slantboard      Knee/Hip Exercises: Standing   Terminal Knee Extension Limitations 10x3", double blue TB      Knee/Hip Exercises: Supine   Quad Sets Right;2 sets;15 reps   Short Arc Quad Sets Both;1 set;15 reps   Heel Slides AROM;10 reps   Knee Extension AROM   Knee Extension Limitations 12 degree extension   Knee Flexion Limitations 113 degrees flexion     Manual Therapy   Manual Therapy Joint mobilization;Myofascial release;Passive ROM  Manual therapy comments Separate rest of session   Joint Mobilization patella mobility and tib/fib   Soft tissue mobilization quad and ITB with LE elevated    Myofascial Release scar tissue massage   Passive ROM extension 5x 30" holds supine   Other Manual Therapy overpressure for knee extension in supine                   PT Short Term Goals - 09/20/16 1452      PT SHORT TERM GOAL #1   Title Patient to demonstrate R knee ROM as being 0-115 degrees in order to improve mechanics and reduce pain    Baseline 7/25- 21-105    Time 3   Period Weeks   Status On-going     PT SHORT TERM GOAL #2   Title Patient to be able to consistently ambulate with heel-toe pattern with LRAD, no increase in pain and unlimited distances, in order to improve general access  to home and community    Baseline 7/25- ongiong difficulty    Time 3   Period Weeks   Status On-going     PT SHORT TERM GOAL #3   Title Patient to experience pain R knee/LE as being no more than 5/10 in order to improve QOL and exercise tolerance    Baseline 7/25- without pain medicine 6/10    Time 3   Period Weeks   Status On-going     PT SHORT TERM GOAL #4   Title Patient to be compliant in correct performace of HEP, to be updated PRN    Baseline 7/25- mixed compliance    Time 1   Period Weeks   Status On-going           PT Long Term Goals - 09/20/16 1454      PT LONG TERM GOAL #1   Title Pateint to demonstrate functional strength as being 5/5 in all tested groups in order to improve mechanics and activity tolerance    Baseline 7/25- sterngth has really improved    Time 6   Period Weeks   Status Partially Met     PT LONG TERM GOAL #2   Title Patient to experience pain as being no more than 2/10 in order to improve QOL and facilitate return to work    Baseline 7/25- 6/10   Time 6   Period Weeks   Status On-going     PT LONG TERM GOAL #3   Title Patient to be able to perform on-feet tasks for at least 4 hours with no increase in pain in order to facilitate ability to return to work    Baseline 7/25- unable    Time 6   Period Weeks   Status On-going     PT LONG TERM GOAL #4   Title Patient to be able to ambulate with no device unlimited distances and will also be able to reciprocally ascend/descent full flight of stairs with U railing and good eccentric control in order to improve home and community access    Baseline 7/25- ongoing    Time 6   Period Weeks   Status On-going               Plan - 09/25/16 1518    Clinical Impression Statement Continued session focus addressing limitations with ROM primarly extension.  Included manual techniques to address musclature restrictions and joint mobility.  Pt continues to present with tightness in quad/hamstings  and ITB and decreased patella mobility.  Tactile cueing to improve quad activation with improved extension to 12 degrees.  AROM 12-114 degrees at EOS.  No reoprts ofi ncreased pain throuigh session.     Rehab Potential Good   Clinical Impairments Affecting Rehab Potential (+) success with PT for TKR in the past, motivated to participate, quick referral to rehab post-surgery   PT Frequency 3x / week   PT Duration 3 weeks   PT Treatment/Interventions ADLs/Self Care Home Management;Biofeedback;Cryotherapy;Moist Heat;DME Instruction;Gait training;Stair training;Functional mobility training;Therapeutic activities;Therapeutic exercise;Balance training;Neuromuscular re-education;Patient/family education;Manual techniques;Scar mobilization;Passive range of motion;Dry needling;Energy conservation   PT Next Visit Plan focus on knee extension ROM aggressively, aggressive manual    PT Home Exercise Plan Eval: quad sets, heel slides, lateral weight shifts, knee flexion stretch, knee extension stretch       Patient will benefit from skilled therapeutic intervention in order to improve the following deficits and impairments:  Abnormal gait, Decreased skin integrity, Increased fascial restricitons, Pain, Decreased coordination, Decreased mobility, Decreased scar mobility, Increased muscle spasms, Decreased activity tolerance, Decreased range of motion, Decreased strength, Hypomobility, Decreased balance, Difficulty walking, Increased edema, Impaired flexibility  Visit Diagnosis: Stiffness of right knee, not elsewhere classified  Muscle weakness (generalized)  Difficulty in walking, not elsewhere classified  Localized edema     Problem List Patient Active Problem List   Diagnosis Date Noted  . S/P right TKA 08/28/2016  . Primary osteoarthritis of left knee 01/01/2014  . Knee stiffness 01/01/2014  . Knee pain, left 01/01/2014  . Difficulty walking 01/01/2014  . S/P left TKA 12/08/2013   Ihor Austin, [place]; [place]  Aldona Lento 09/25/2016, 3:54 PM  [place], [place], [address] Phone: [phone]   Fax:  [phone]  Name: Teresa Charles MRN: [id number] Date of Birth: 1960/11/27

## 2016-09-27 ENCOUNTER — Ambulatory Visit (HOSPITAL_COMMUNITY): Payer: No Typology Code available for payment source | Attending: Orthopedic Surgery

## 2016-09-27 DIAGNOSIS — R262 Difficulty in walking, not elsewhere classified: Secondary | ICD-10-CM | POA: Diagnosis present

## 2016-09-27 DIAGNOSIS — M25661 Stiffness of right knee, not elsewhere classified: Secondary | ICD-10-CM | POA: Diagnosis not present

## 2016-09-27 DIAGNOSIS — R600 Localized edema: Secondary | ICD-10-CM | POA: Insufficient documentation

## 2016-09-27 DIAGNOSIS — M6281 Muscle weakness (generalized): Secondary | ICD-10-CM | POA: Diagnosis present

## 2016-09-27 NOTE — Therapy (Signed)
[facility] [place], [place], [address] Phone: [phone]   Fax:  [phone]  Physical Therapy Treatment  Patient Details  Name: Teresa Charles MRN: [id number] Date of Birth: 07/22/1960 Referring Provider: Danae Orleans  Encounter Date: 09/27/2016      PT End of Session - 09/27/16 1524    Visit Number 12   Number of Visits 19   Date for PT Re-Evaluation 10/11/16   Authorization Type [employer]    Authorization Time Period 08/29/16 to 10/11/16   PT Start Time 1520   PT Stop Time 1603   PT Time Calculation (min) 43 min   Activity Tolerance Patient tolerated treatment well;No increased pain   Behavior During Therapy WFL for tasks assessed/performed      Past Medical History:  Diagnosis Date  . Anxiety   . Arthritis   . Depression   . Dysrhythmia    palpitations  . Hypothyroidism     Past Surgical History:  Procedure Laterality Date  . CARDIAC CATHETERIZATION  2009  . TOTAL KNEE ARTHROPLASTY Left 12/08/2013   Procedure: LEFT TOTAL KNEE ARTHROPLASTY;  Surgeon: Mauri Pole, MD;  Location: [facility];  Service: Orthopedics;  Laterality: Left;  . TOTAL KNEE ARTHROPLASTY Right 08/28/2016   Procedure: RIGHT TOTAL KNEE ARTHROPLASTY;  Surgeon: Paralee Cancel, MD;  Location: [facility];  Service: Orthopedics;  Laterality: Right;    There were no vitals filed for this visit.      Subjective Assessment - 09/27/16 1518    Subjective Pt entered dept her husband's SPC, reports she rarely used it.  Reports she has shingles today, been to MD and given medication for it.     Patient Stated Goals get back to work at the [employer]    Currently in Pain? Yes   Pain Score 4    Pain Location Knee   Pain Orientation Right   Pain Descriptors / Indicators Dull;Aching   Pain Type Surgical pain   Pain Onset 1 to 4 weeks ago   Pain Frequency Constant   Aggravating Factors  being up on her feet too much   Pain Relieving Factors ice and  pain medication   Effect of Pain on Daily Activities severe            [facility] PT Assessment - 09/27/16 0001      Assessment   Medical Diagnosis R TKR    Referring Provider matthew Babish   Onset Date/Surgical Date 08/28/16   Next MD Visit 10/04/16   Prior Therapy PT for her L knee 3 years ago      Precautions   Precautions Fall                     [employer] Adult PT Treatment/Exercise - 09/27/16 0001      Knee/Hip Exercises: Stretches   Active Hamstring Stretch Right;3 reps;30 seconds   Active Hamstring Stretch Limitations supine with rope   Knee: Self-Stretch Limitations 12in step 10x 10" for mobility initially today.     Gastroc Stretch Both;3 reps;30 seconds   Gastroc Stretch Limitations slantboard      Knee/Hip Exercises: Standing   Terminal Knee Extension Limitations 15x3", double blue TB    Gait Training inside // bars heel strike with TKE 10x each foot forward     Knee/Hip Exercises: Seated   Long Arc Quad Right;1 set;15 reps     Knee/Hip Exercises: Supine   Quad Sets 15 reps  Short Arc [employer] 15 reps   Short Arc Quad Sets Limitations 5" holds   Heel Slides AROM;10 reps   Knee Extension AROM   Knee Extension Limitations 8 degree extension   Knee Flexion Limitations 113 degrees flexion     Manual Therapy   Manual Therapy Joint mobilization;Myofascial release;Passive ROM;Edema management   Manual therapy comments Separate rest of session   Edema Management Gentle retrograde massage proximal to distal, LE elevated    Joint Mobilization patella mobility and tib/fib   Soft tissue mobilization quad and ITB with LE elevated    Myofascial Release scar tissue massage   Passive ROM extension 5x 30" holds supine   Other Manual Therapy overpressure for knee extension in supine                   PT Short Term Goals - 09/20/16 1452      PT SHORT TERM GOAL #1   Title Patient to demonstrate R knee ROM as being 0-115 degrees in order to improve  mechanics and reduce pain    Baseline 7/25- 21-105    Time 3   Period Weeks   Status On-going     PT SHORT TERM GOAL #2   Title Patient to be able to consistently ambulate with heel-toe pattern with LRAD, no increase in pain and unlimited distances, in order to improve general access to home and community    Baseline 7/25- ongiong difficulty    Time 3   Period Weeks   Status On-going     PT SHORT TERM GOAL #3   Title Patient to experience pain R knee/LE as being no more than 5/10 in order to improve QOL and exercise tolerance    Baseline 7/25- without pain medicine 6/10    Time 3   Period Weeks   Status On-going     PT SHORT TERM GOAL #4   Title Patient to be compliant in correct performace of HEP, to be updated PRN    Baseline 7/25- mixed compliance    Time 1   Period Weeks   Status On-going           PT Long Term Goals - 09/20/16 1454      PT LONG TERM GOAL #1   Title Pateint to demonstrate functional strength as being 5/5 in all tested groups in order to improve mechanics and activity tolerance    Baseline 7/25- sterngth has really improved    Time 6   Period Weeks   Status Partially Met     PT LONG TERM GOAL #2   Title Patient to experience pain as being no more than 2/10 in order to improve QOL and facilitate return to work    Baseline 7/25- 6/10   Time 6   Period Weeks   Status On-going     PT LONG TERM GOAL #3   Title Patient to be able to perform on-feet tasks for at least 4 hours with no increase in pain in order to facilitate ability to return to work    Baseline 7/25- unable    Time 6   Period Weeks   Status On-going     PT LONG TERM GOAL #4   Title Patient to be able to ambulate with no device unlimited distances and will also be able to reciprocally ascend/descent full flight of stairs with U railing and good eccentric control in order to improve home and community access    Baseline 7/25- ongoing  Time 6   Period Weeks   Status On-going                Plan - 09/27/16 1605    Clinical Impression Statement Continued session foucs addressing limitatin with ROM primarly extension.  Included therex and manual technqieus to address musculature restriciton and joint mobility.  Pt continues to present with significant tightness in vastus latelis and ITB though improved patella mobilty noted following retro massage and joint mobs.  Vast improvements noted with quad activation following tactile cueing to improve quad contraction, AROM 8-112 degrees with passive extension to 6 degrees today.  Pt arrived with husband's SPC, pt educated on proper hand use and height to improve gait mechanics though majority of session spent ambulating with no AD wiht min cueing for heel strike for TKE with gait.     Rehab Potential Good   Clinical Impairments Affecting Rehab Potential (+) success with PT for TKR in the past, motivated to participate, quick referral to rehab post-surgery   PT Frequency 3x / week   PT Duration 3 weeks   PT Treatment/Interventions ADLs/Self Care Home Management;Biofeedback;Cryotherapy;Moist Heat;DME Instruction;Gait training;Stair training;Functional mobility training;Therapeutic activities;Therapeutic exercise;Balance training;Neuromuscular re-education;Patient/family education;Manual techniques;Scar mobilization;Passive range of motion;Dry needling;Energy conservation   PT Next Visit Plan focus on knee extension ROM aggressively, aggressive manual.  Reviewe goals prior MD apt next week   PT Home Exercise Plan Eval: quad sets, heel slides, lateral weight shifts, knee flexion stretch, knee extension stretch       Patient will benefit from skilled therapeutic intervention in order to improve the following deficits and impairments:  Abnormal gait, Decreased skin integrity, Increased fascial restricitons, Pain, Decreased coordination, Decreased mobility, Decreased scar mobility, Increased muscle spasms, Decreased activity  tolerance, Decreased range of motion, Decreased strength, Hypomobility, Decreased balance, Difficulty walking, Increased edema, Impaired flexibility  Visit Diagnosis: Stiffness of right knee, not elsewhere classified  Muscle weakness (generalized)  Difficulty in walking, not elsewhere classified  Localized edema     Problem List Patient Active Problem List   Diagnosis Date Noted  . S/P right TKA 08/28/2016  . Primary osteoarthritis of left knee 01/01/2014  . Knee stiffness 01/01/2014  . Knee pain, left 01/01/2014  . Difficulty walking 01/01/2014  . S/P left TKA 12/08/2013   Ihor Austin, [place]; [place]  Aldona Lento 09/27/2016, 4:20 PM  [facility] [address] [place], [place], [address] Phone: [phone]   Fax:  [phone]  Name: LORENE KLIMAS MRN: [id number] Date of Birth: 11/03/1960

## 2016-10-02 ENCOUNTER — Ambulatory Visit (HOSPITAL_COMMUNITY): Payer: No Typology Code available for payment source

## 2016-10-02 DIAGNOSIS — M25661 Stiffness of right knee, not elsewhere classified: Secondary | ICD-10-CM

## 2016-10-02 DIAGNOSIS — M6281 Muscle weakness (generalized): Secondary | ICD-10-CM

## 2016-10-02 DIAGNOSIS — R600 Localized edema: Secondary | ICD-10-CM

## 2016-10-02 DIAGNOSIS — R262 Difficulty in walking, not elsewhere classified: Secondary | ICD-10-CM

## 2016-10-02 NOTE — Therapy (Signed)
[facility] [address] [place], [place], [address] Phone: [phone]   Fax:  [phone]  Physical Therapy Treatment  Patient Details  Name: Teresa Charles MRN: [id number] Date of Birth: 02/19/1961 Referring Provider: Charlann Boxer  Encounter Date: 10/02/2016      PT End of Session - 10/02/16 1528    Visit Number 13   Number of Visits 19   Date for PT Re-Evaluation 10/11/16   Authorization Type [employer]    Authorization Time Period 08/29/16 to 10/11/16   PT Start Time 1525   PT Stop Time 1604   PT Time Calculation (min) 39 min   Activity Tolerance Patient tolerated treatment well;No increased pain   Behavior During Therapy WFL for tasks assessed/performed      Past Medical History:  Diagnosis Date  . Anxiety   . Arthritis   . Depression   . Dysrhythmia    palpitations  . Hypothyroidism     Past Surgical History:  Procedure Laterality Date  . CARDIAC CATHETERIZATION  2009  . TOTAL KNEE ARTHROPLASTY Left 12/08/2013   Procedure: LEFT TOTAL KNEE ARTHROPLASTY;  Surgeon: Shelda Pal, MD;  Location: [facility];  Service: Orthopedics;  Laterality: Left;  . TOTAL KNEE ARTHROPLASTY Right 08/28/2016   Procedure: RIGHT TOTAL KNEE ARTHROPLASTY;  Surgeon: Durene Romans, MD;  Location: [facility];  Service: Orthopedics;  Laterality: Right;    There were no vitals filed for this visit.      Subjective Assessment - 10/02/16 1526    Subjective Pt stated she twisted her knee on Friday with increased pain over weekend.  Current pain scale 5/10   Pertinent History history L TKA, R TKA done 7/2, hypothyroidsm    Patient Stated Goals get back to work at the [employer]    Currently in Pain? Yes   Pain Score 5    Pain Location Knee   Pain Orientation Right   Pain Type Surgical pain   Pain Onset 1 to 4 weeks ago   Pain Frequency Constant   Aggravating Factors  being up on her feet too much   Pain Relieving Factors ice and pain medication   Effect  of Pain on Daily Activities severe            [facility] PT Assessment - 10/02/16 0001      Assessment   Medical Diagnosis R TKR    Referring Provider Charlann Boxer   Onset Date/Surgical Date 08/28/16   Next MD Visit 10/04/16   Prior Therapy PT for her L knee 3 years ago      Precautions   Precautions Fall                     [facility] Adult PT Treatment/Exercise - 10/02/16 0001      Knee/Hip Exercises: Stretches   Active Hamstring Stretch Right;3 reps;30 seconds   Active Hamstring Stretch Limitations supine with rope   Gastroc Stretch Both;3 reps;30 seconds   Gastroc Stretch Limitations slantboard      Knee/Hip Exercises: Aerobic   Tread Mill Retro gait at .9 mph wiht HHA to improve TKE with gait     Knee/Hip Exercises: Standing   Terminal Knee Extension Limitations 15x3", double blue TB    Other Standing Knee Exercises retro gait 2Rt in // bars cueing to improve extension     Knee/Hip Exercises: Supine   Quad Sets 15 reps   Quad Sets Limitations 2" holds   Short [employer]  Sets 15 reps   Short Arc Quad Sets Limitations 5" holds   Knee Extension AAROM   Knee Extension Limitations 8 degree extension   Knee Flexion Limitations 113 degrees flexion   Other Supine Knee/Hip Exercises knee extension stretch 3x2' at a time, 3# weight      Manual Therapy   Manual Therapy Joint mobilization;Myofascial release;Passive ROM;Edema management   Manual therapy comments Separate rest of session   Edema Management Retro massage with LE elevated x 5 min   Joint Mobilization patella mobility and tib/fib   Soft tissue mobilization quad and ITB with LE elevated    Myofascial Release scar tissue massage                  PT Short Term Goals - 10/02/16 1529      PT SHORT TERM GOAL #1   Title Patient to demonstrate R knee ROM as being 0-115 degrees in order to improve mechanics and reduce pain      PT SHORT TERM GOAL #2   Title Patient to be able to consistently ambulate with  heel-toe pattern with LRAD, no increase in pain and unlimited distances, in order to improve general access to home and community    Status On-going     PT SHORT TERM GOAL #3   Title Patient to experience pain R knee/LE as being no more than 5/10 in order to improve QOL and exercise tolerance    Baseline 10/02/16: continue to limited by pain requiring pain medication     PT SHORT TERM GOAL #4   Title Patient to be compliant in correct performace of HEP, to be updated PRN    Baseline 10/02/16:  mixed compliance   Status On-going           PT Long Term Goals - 10/02/16 1531      PT LONG TERM GOAL #1   Title Pateint to demonstrate functional strength as being 5/5 in all tested groups in order to improve mechanics and activity tolerance      PT LONG TERM GOAL #2   Title Patient to experience pain as being no more than 2/10 in order to improve QOL and facilitate return to work    Status On-going     PT LONG TERM GOAL #3   Title Patient to be able to perform on-feet tasks for at least 4 hours with no increase in pain in order to facilitate ability to return to work      PT LONG TERM GOAL #4   Title Patient to be able to ambulate with no device unlimited distances and will also be able to reciprocally ascend/descent full flight of stairs with U railing and good eccentric control in order to improve home and community access    Status On-going     PT LONG TERM GOAL #5   Title Patient will be able to squat and pivot while lifting 50lb to be able to return to work.                Plan - 10/02/16 1620    Clinical Impression Statement Pt continues to be limited knee mobility especially with extension.  Continued with therex and manual technqiues to address tight musculature and joint mobility restrictions.  Pt continues to present with tightness in vastus lateralis, ITB and hamstring.  Reviewed compliance iwht HEP with reports of partial compliance due to pain.  Able to achieve AROM  8-112 degrees with passive extension to 5 degress  today.  Added retro gait training to improve TKE with heel strike.  No reports of increased pain through session.     Rehab Potential Good   Clinical Impairments Affecting Rehab Potential (+) success with PT for TKR in the past, motivated to participate, quick referral to rehab post-surgery   PT Frequency 3x / week   PT Duration 3 weeks   PT Treatment/Interventions ADLs/Self Care Home Management;Biofeedback;Cryotherapy;Moist Heat;DME Instruction;Gait training;Stair training;Functional mobility training;Therapeutic activities;Therapeutic exercise;Balance training;Neuromuscular re-education;Patient/family education;Manual techniques;Scar mobilization;Passive range of motion;Dry needling;Energy conservation   PT Next Visit Plan F/U with MD apt.  Continue focus on knee extension ROM aggressively, aggressive manual.     PT Home Exercise Plan Eval: quad sets, heel slides, lateral weight shifts, knee flexion stretch, knee extension stretch       Patient will benefit from skilled therapeutic intervention in order to improve the following deficits and impairments:  Abnormal gait, Decreased skin integrity, Increased fascial restricitons, Pain, Decreased coordination, Decreased mobility, Decreased scar mobility, Increased muscle spasms, Decreased activity tolerance, Decreased range of motion, Decreased strength, Hypomobility, Decreased balance, Difficulty walking, Increased edema, Impaired flexibility  Visit Diagnosis: Stiffness of right knee, not elsewhere classified  Muscle weakness (generalized)  Difficulty in walking, not elsewhere classified  Localized edema     Problem List Patient Active Problem List   Diagnosis Date Noted  . S/P right TKA 08/28/2016  . Primary osteoarthritis of left knee 01/01/2014  . Knee stiffness 01/01/2014  . Knee pain, left 01/01/2014  . Difficulty walking 01/01/2014  . S/P left TKA 12/08/2013   Becky Saxasey Cockerham,  LPTA; CBIS [phone]  Juel BurrowCockerham, Casey Jo 10/02/2016, 4:26 PM  [facility] [address], [place], [id number] Phone: [phone]   Fax:  [phone]  Name: Teresa Charles MRN: [id number] Date of Birth: 1960-05-18

## 2016-10-04 ENCOUNTER — Ambulatory Visit (HOSPITAL_COMMUNITY): Payer: No Typology Code available for payment source | Admitting: Physical Therapy

## 2016-10-04 DIAGNOSIS — M25661 Stiffness of right knee, not elsewhere classified: Secondary | ICD-10-CM | POA: Diagnosis not present

## 2016-10-04 DIAGNOSIS — R262 Difficulty in walking, not elsewhere classified: Secondary | ICD-10-CM

## 2016-10-04 DIAGNOSIS — R600 Localized edema: Secondary | ICD-10-CM

## 2016-10-04 DIAGNOSIS — M6281 Muscle weakness (generalized): Secondary | ICD-10-CM

## 2016-10-04 NOTE — Therapy (Signed)
[facility] [address] [place], [place], [address] Phone: [phone]   Fax:  [phone]  Physical Therapy Treatment  Patient Details  Name: Teresa Charles MRN: [id number] Date of Birth: 09-Jun-1960 Referring Provider: Charlann Boxer  Encounter Date: 10/04/2016      PT End of Session - 10/04/16 1743    Visit Number 14   Number of Visits 19   Date for PT Re-Evaluation 10/11/16   Authorization Type [employer]    Authorization Time Period 08/29/16 to 10/11/16   PT Start Time 1435   PT Stop Time 1513   PT Time Calculation (min) 38 min   Activity Tolerance Patient tolerated treatment well   Behavior During Therapy [facility] for tasks assessed/performed      Past Medical History:  Diagnosis Date  . Anxiety   . Arthritis   . Depression   . Dysrhythmia    palpitations  . Hypothyroidism     Past Surgical History:  Procedure Laterality Date  . CARDIAC CATHETERIZATION  2009  . TOTAL KNEE ARTHROPLASTY Left 12/08/2013   Procedure: LEFT TOTAL KNEE ARTHROPLASTY;  Surgeon: Shelda Pal, MD;  Location: [facility];  Service: Orthopedics;  Laterality: Left;  . TOTAL KNEE ARTHROPLASTY Right 08/28/2016   Procedure: RIGHT TOTAL KNEE ARTHROPLASTY;  Surgeon: Durene Romans, MD;  Location: [facility];  Service: Orthopedics;  Laterality: Right;    There were no vitals filed for this visit.      Subjective Assessment - 10/04/16 1741    Subjective Patient arrivse stating her knee continues to hurt, this is much harder than her other one was    Pertinent History history L TKA, R TKA done 7/2, hypothyroidsm    Patient Stated Goals get back to work at the [employer]    Currently in Pain? Yes   Pain Score 5    Pain Location Knee   Pain Orientation Right                         [facility] Adult PT Treatment/Exercise - 10/04/16 0001      Knee/Hip Exercises: Stretches   Knee: Self-Stretch Limitations 15x15" 12 inch step      Knee/Hip Exercises: Standing    Terminal Knee Extension Limitations 20x3" holds      Knee/Hip Exercises: Supine   Other Supine Knee/Hip Exercises supine knee extension stretch x5 minutes with 4#      Manual Therapy   Manual Therapy Soft tissue mobilization;Joint mobilization;Other (comment)   Manual therapy comments Separate rest of session   Joint Mobilization patella mobility all directions focus on vertical tracking path    Soft tissue mobilization extensive scar massage especially distally    Other Manual Therapy overpressure for knee extension in supine              Balance Exercises - 10/04/16 1743      Balance Exercises: Standing   Tandem Stance Eyes open;Foam/compliant surface;3 reps;15 secs           PT Education - 10/04/16 1743    Education provided Yes   Education Details use vitamin E oil on incision, OK to aggressively massage knee and scar at this point/benetifts to knee; compression stocking outlet in [place] and possible benefits of compressoin stockings vs TEDs    Person(s) Educated Patient   Methods Explanation   Comprehension Verbalized understanding          PT Short Term Goals - 10/02/16 1529  PT SHORT TERM GOAL #1   Title Patient to demonstrate R knee ROM as being 0-115 degrees in order to improve mechanics and reduce pain      PT SHORT TERM GOAL #2   Title Patient to be able to consistently ambulate with heel-toe pattern with LRAD, no increase in pain and unlimited distances, in order to improve general access to home and community    Status On-going     PT SHORT TERM GOAL #3   Title Patient to experience pain R knee/LE as being no more than 5/10 in order to improve QOL and exercise tolerance    Baseline 10/02/16: continue to limited by pain requiring pain medication     PT SHORT TERM GOAL #4   Title Patient to be compliant in correct performace of HEP, to be updated PRN    Baseline 10/02/16:  mixed compliance   Status On-going           PT Long Term Goals -  10/02/16 1531      PT LONG TERM GOAL #1   Title Pateint to demonstrate functional strength as being 5/5 in all tested groups in order to improve mechanics and activity tolerance      PT LONG TERM GOAL #2   Title Patient to experience pain as being no more than 2/10 in order to improve QOL and facilitate return to work    Status On-going     PT LONG TERM GOAL #3   Title Patient to be able to perform on-feet tasks for at least 4 hours with no increase in pain in order to facilitate ability to return to work      PT LONG TERM GOAL #4   Title Patient to be able to ambulate with no device unlimited distances and will also be able to reciprocally ascend/descent full flight of stairs with U railing and good eccentric control in order to improve home and community access    Status On-going     PT LONG TERM GOAL #5   Title Patient will be able to squat and pivot while lifting 50lb to be able to return to work.                Plan - 10/04/16 1744    Clinical Impression Statement : Patient arrives today stating her knee continues to give her pain; continued with all manual for ROM as well as overpressure for knee extension. Extension ROM appears to be slowly improving moving forwards but will require ongoing skilled attention and care moving forward. Otherwise continued with mobility exercises and stretches, strength and balance performed as time allowed this session. Discussed compression stocking use and stocking outlet this session as well due to ongoing edema in knee. Continue to be unsure regarding HEP compliance at home.    Rehab Potential Good   Clinical Impairments Affecting Rehab Potential (+) success with PT for TKR in the past, motivated to participate, quick referral to rehab post-surgery   PT Frequency 3x / week   PT Duration 3 weeks   PT Treatment/Interventions ADLs/Self Care Home Management;Biofeedback;Cryotherapy;Moist Heat;DME Instruction;Gait training;Stair  training;Functional mobility training;Therapeutic activities;Therapeutic exercise;Balance training;Neuromuscular re-education;Patient/family education;Manual techniques;Scar mobilization;Passive range of motion;Dry needling;Energy conservation   PT Next Visit Plan .  Continue focus on knee extension ROM aggressively, aggressive manual.  MD appt now next Wednesday.    PT Home Exercise Plan Eval: quad sets, heel slides, lateral weight shifts, knee flexion stretch, knee extension stretch    Consulted and Agree  with Plan of Care Patient      Patient will benefit from skilled therapeutic intervention in order to improve the following deficits and impairments:  Abnormal gait, Decreased skin integrity, Increased fascial restricitons, Pain, Decreased coordination, Decreased mobility, Decreased scar mobility, Increased muscle spasms, Decreased activity tolerance, Decreased range of motion, Decreased strength, Hypomobility, Decreased balance, Difficulty walking, Increased edema, Impaired flexibility  Visit Diagnosis: Stiffness of right knee, not elsewhere classified  Muscle weakness (generalized)  Difficulty in walking, not elsewhere classified  Localized edema     Problem List Patient Active Problem List   Diagnosis Date Noted  . S/P right TKA 08/28/2016  . Primary osteoarthritis of left knee 01/01/2014  . Knee stiffness 01/01/2014  . Knee pain, left 01/01/2014  . Difficulty walking 01/01/2014  . S/P left TKA 12/08/2013    Nedra HaiKristen Cristle Jared PT, DPT [phone]  [facility] [address] [place], [place], [address] Phone: [phone]   Fax:  [phone]  Name: Lequita Asalam E Musolino MRN: [id number] Date of Birth: June 05, 1960

## 2016-10-09 ENCOUNTER — Ambulatory Visit (HOSPITAL_COMMUNITY): Payer: No Typology Code available for payment source | Admitting: Physical Therapy

## 2016-10-09 DIAGNOSIS — R262 Difficulty in walking, not elsewhere classified: Secondary | ICD-10-CM

## 2016-10-09 DIAGNOSIS — M6281 Muscle weakness (generalized): Secondary | ICD-10-CM

## 2016-10-09 DIAGNOSIS — R600 Localized edema: Secondary | ICD-10-CM

## 2016-10-09 DIAGNOSIS — M25661 Stiffness of right knee, not elsewhere classified: Secondary | ICD-10-CM | POA: Diagnosis not present

## 2016-10-09 NOTE — Therapy (Signed)
[place], [place], [address] Phone: [phone]   Fax:  [phone]  Physical Therapy Treatment (Re-Assessment)  Patient Details  Name: Teresa Charles MRN: [id number] Date of Birth: 1961-01-23 Referring Provider: Paralee Cancel   Encounter Date: 10/09/2016      PT End of Session - 10/09/16 1732    Visit Number 15   Number of Visits 21   Date for PT Re-Evaluation 11/01/16   Authorization Type [employer]    Authorization Time Period 03/04/36 to 4/66/59; recert done 9/35    PT Start Time 1650   PT Stop Time 1728   PT Time Calculation (min) 38 min   Activity Tolerance Patient tolerated treatment well   Behavior During Therapy [facility] for tasks assessed/performed      Past Medical History:  Diagnosis Date  . Anxiety   . Arthritis   . Depression   . Dysrhythmia    palpitations  . Hypothyroidism     Past Surgical History:  Procedure Laterality Date  . CARDIAC CATHETERIZATION  2009  . TOTAL KNEE ARTHROPLASTY Left 12/08/2013   Procedure: LEFT TOTAL KNEE ARTHROPLASTY;  Surgeon: Mauri Pole, MD;  Location: [facility];  Service: Orthopedics;  Laterality: Left;  . TOTAL KNEE ARTHROPLASTY Right 08/28/2016   Procedure: RIGHT TOTAL KNEE ARTHROPLASTY;  Surgeon: Paralee Cancel, MD;  Location: [facility];  Service: Orthopedics;  Laterality: Right;    There were no vitals filed for this visit.      Subjective Assessment - 10/09/16 1651    Subjective patient arrives stating she is doing OK, her knee was hurting a lot this weekend but she was very busy out in the yard and may have overdone it    Pertinent History history L TKA, R TKA done 7/2, hypothyroidsm    Patient Stated Goals get back to work at the [employer]    Currently in Pain? Yes   Pain Score 4    Pain Location Knee   Pain Orientation Right   Pain Descriptors / Indicators Aching;Dull;Throbbing   Pain Type Surgical pain   Pain Radiating Towards none    Pain  Onset 1 to 4 weeks ago   Pain Frequency Constant   Aggravating Factors  being up on her feet too much    Pain Relieving Factors ice and pain medication    Effect of Pain on Daily Activities severe            [facility] PT Assessment - 10/09/16 0001      Assessment   Medical Diagnosis R TKR    Referring Provider Paralee Cancel    Onset Date/Surgical Date 08/28/16   Next MD Visit Dr. Alvan Dame August 15th    Prior Therapy PT for her L knee 3 years ago      Precautions   Precautions [place]   Has the patient fallen in the past 6 months Yes   How many times? 2-3   Has the patient had a decrease in activity level because of a fear of falling?  Yes   Is the patient reluctant to leave their home because of a fear of falling?  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    Leisure outside activities, gardneing      AROM  Right Knee Extension 5   Right Knee Flexion 115     Strength   Right Hip Flexion 5/5   Right Hip Extension 3-/5   Right Hip ABduction 5/5   Left Hip Flexion 5/5   Left Hip Extension 3/5   Left Hip ABduction 4+/5   Right Knee Extension 5/5   Left Knee Extension 5/5     Ambulation/Gait   Gait Comments R knee stiffness; weakness (concentrically and eccentrically) noted with stairs      6 minute walk test results    Aerobic Endurance Distance Walked 750   Endurance additional comments 3MWT no device      High Level Balance   High Level Balance Comments TUG 7.3 no device                      [facility] Adult PT Treatment/Exercise - 10/09/16 0001      Knee/Hip Exercises: Stretches   Active Hamstring Stretch Right;60 seconds;3 reps   Active Hamstring Stretch Limitations stairs      Manual Therapy   Manual Therapy Joint mobilization;Soft tissue mobilization   Manual  therapy comments Separate rest of session   Joint Mobilization patella mobility all directions focus on vertical tracking path    Soft tissue mobilization extensive scar massage especially distally    Other Manual Therapy overpressure for knee extension in supine                 PT Education - 10/09/16 1731    Education provided Yes   Education Details progress towards goals, progress thus far in the "big" picture since surgery was only 6 weeks ago; POC moving forward; continued education regarding manual tecniques at home    Person(s) Educated Patient   Methods Explanation   Comprehension Verbalized understanding          PT Short Term Goals - 10/09/16 1709      PT SHORT TERM GOAL #1   Title Patient to demonstrate R knee ROM as being 0-115 degrees in order to improve mechanics and reduce pain    Baseline 8/13- 5-115   Time 3   Period Weeks   Status Partially Met     PT SHORT TERM GOAL #2   Title Patient to be able to consistently ambulate with heel-toe pattern with LRAD, no increase in pain and unlimited distances, in order to improve general access to home and community    Baseline 8/13- continues to have intermittent difficulty   Time 3   Period Weeks   Status Partially Met     PT SHORT TERM GOAL #3   Title Patient to experience pain R knee/LE as being no more than 5/10 in order to improve QOL and exercise tolerance    Baseline 8/13-  continues to require pain medicine    Time 3   Period Weeks   Status On-going     PT SHORT TERM GOAL #4   Title Patient to be compliant in correct performace of HEP, to be updated PRN    Baseline 8/13-  improving but still mixed compliance    Time 1   Period Weeks   Status Partially Met           PT Long Term Goals - 10/09/16 1712      PT LONG TERM GOAL #1   Title Pateint to demonstrate functional strength as being 5/5 in all tested groups in order to improve mechanics and activity tolerance  Baseline 8/13- hip  extensors remain weak    Time 6   Period Weeks   Status Partially Met     PT LONG TERM GOAL #2   Title Patient to experience pain as being no more than 2/10 in order to improve QOL and facilitate return to work    Baseline 8/13- continues to require pain medicine    Time 6   Period Weeks   Status On-going     PT LONG TERM GOAL #3   Title Patient to be able to perform on-feet tasks for at least 4 hours with no increase in pain in order to facilitate ability to return to work    Baseline 8/13- unable    Time 6   Period Weeks   Status On-going     PT LONG TERM GOAL #4   Title Patient to be able to ambulate with no device unlimited distances and will also be able to reciprocally ascend/descent full flight of stairs with U railing and good eccentric control in order to improve home and community access    Baseline 8/13- ongoing    Time 6   Period Weeks   Status On-going               Plan - 10/09/16 1733    Clinical Impression Statement Re-assessment performed today. Patient appears to have improved and is making much better progress towards all goals at this point; she does however continue to demonstrate mild deficits in knee ROM, difficulty with functional tasks such as stair navigation, pain and localized edema R knee, and reduced ability to perform PLOF and work based tasks. Recommend continuation of skilled PT services in order to address functional deficits, reduce pain, and improve tolerance to PLOF based tasks moving forward.    Rehab Potential Good   Clinical Impairments Affecting Rehab Potential (+) success with PT for TKR in the past, motivated to participate, quick referral to rehab post-surgery   PT Frequency 2x / week   PT Duration 3 weeks   PT Treatment/Interventions ADLs/Self Care Home Management;Biofeedback;Cryotherapy;Moist Heat;DME Instruction;Gait training;Stair training;Functional mobility training;Therapeutic activities;Therapeutic exercise;Balance  training;Neuromuscular re-education;Patient/family education;Manual techniques;Scar mobilization;Passive range of motion;Dry needling;Energy conservation   PT Next Visit Plan Update HEP. continue to work on knee extension ROM, monitor knee flexion ROM and address as needed. Continue manual. Can progress to [facility] exercise focus as ROM continues to normalize.    PT Home Exercise Plan Eval: quad sets, heel slides, lateral weight shifts, knee flexion stretch, knee extension stretch    Consulted and Agree with Plan of Care Patient      Patient will benefit from skilled therapeutic intervention in order to improve the following deficits and impairments:  Abnormal gait, Decreased skin integrity, Increased fascial restricitons, Pain, Decreased coordination, Decreased mobility, Decreased scar mobility, Increased muscle spasms, Decreased activity tolerance, Decreased range of motion, Decreased strength, Hypomobility, Decreased balance, Difficulty walking, Increased edema, Impaired flexibility  Visit Diagnosis: Stiffness of right knee, not elsewhere classified - Plan: PT plan of care cert/re-cert  Muscle weakness (generalized) - Plan: PT plan of care cert/re-cert  Difficulty in walking, not elsewhere classified - Plan: PT plan of care cert/re-cert  Localized edema - Plan: PT plan of care cert/re-cert     Problem List Patient Active Problem List   Diagnosis Date Noted  . S/P right TKA 08/28/2016  . Primary osteoarthritis of left knee 01/01/2014  . Knee stiffness 01/01/2014  . Knee pain, left 01/01/2014  . Difficulty walking 01/01/2014  .  S/P left TKA 12/08/2013    Deniece Ree PT, DPT [place] [address] [place], [place], [address] Phone: [phone]   Fax:  [phone]  Name: JOEANNE ROBICHEAUX MRN: [id number] Date of Birth: [birth year]

## 2016-10-11 ENCOUNTER — Ambulatory Visit (HOSPITAL_COMMUNITY): Payer: No Typology Code available for payment source

## 2016-10-11 DIAGNOSIS — R262 Difficulty in walking, not elsewhere classified: Secondary | ICD-10-CM

## 2016-10-11 DIAGNOSIS — M25661 Stiffness of right knee, not elsewhere classified: Secondary | ICD-10-CM | POA: Diagnosis not present

## 2016-10-11 DIAGNOSIS — R600 Localized edema: Secondary | ICD-10-CM

## 2016-10-11 DIAGNOSIS — M6281 Muscle weakness (generalized): Secondary | ICD-10-CM

## 2016-10-11 NOTE — Patient Instructions (Addendum)
Hamstring Stretch    With other leg bent, foot flat, grasp right leg and slowly try to straighten knee. Hold 30 seconds. Repeat 3 times. Do 1-2 sessions per day.  http://gt2.exer.us/280   Copyright  VHI. All rights reserved.   Knee Extension: Terminal - Standing (Single Leg)    Face anchor in shoulder width stance, band around knee. Allow tension of band to slightly bend knee. Pull leg back, straightening knee. Repeat 20 times per set. Repeat with other leg.  Do at least 4 sessions per week. Anchor Height: Knee  http://tub.exer.us/36   Copyright  VHI. All rights reserved.   Bridge    Lie back, legs bent. Inhale, pressing hips up. Keeping ribs in, lengthen lower back. Exhale, rolling down along spine from top. Repeat 15 times. Do 2 sessions per day.  http://pm.exer.us/55   Copyright  VHI. All rights reserved.

## 2016-10-11 NOTE — Therapy (Signed)
[place], [place], [address] Phone: [phone]   Fax:  [phone]  Physical Therapy Treatment  Patient Details  Name: Teresa Charles MRN: [id number] Date of Birth: Jun 16, 1960 Referring Provider: Paralee Cancel   Encounter Date: 10/11/2016      PT End of Session - 10/11/16 1443    Visit Number 16   Number of Visits 19   Date for PT Re-Evaluation 11/01/16   Authorization Type [employer]    Authorization Time Period 06/02/48 to 3/54/65; recert done 6/81-04/05/5168   PT Start Time 1436   PT Stop Time 1520   PT Time Calculation (min) 44 min   Activity Tolerance Patient tolerated treatment well;No increased pain   Behavior During Therapy WFL for tasks assessed/performed      Past Medical History:  Diagnosis Date  . Anxiety   . Arthritis   . Depression   . Dysrhythmia    palpitations  . Hypothyroidism     Past Surgical History:  Procedure Laterality Date  . CARDIAC CATHETERIZATION  2009  . TOTAL KNEE ARTHROPLASTY Left 12/08/2013   Procedure: LEFT TOTAL KNEE ARTHROPLASTY;  Surgeon: Mauri Pole, MD;  Location: [facility];  Service: Orthopedics;  Laterality: Left;  . TOTAL KNEE ARTHROPLASTY Right 08/28/2016   Procedure: RIGHT TOTAL KNEE ARTHROPLASTY;  Surgeon: Paralee Cancel, MD;  Location: [facility];  Service: Orthopedics;  Laterality: Right;    There were no vitals filed for this visit.      Subjective Assessment - 10/11/16 1439    Subjective Pt reports she went to MD earlier today, reports happy with progress and to continue for 6 more weeks.  Reports removal of iron and refill with pain medication.  Rt hip is bothering her today, minimal pain wiht knee, pain scale 3/10.   Pertinent History history L TKA, R TKA done 7/2, hypothyroidsm    Patient Stated Goals get back to work at the [employer]    Currently in Pain? Yes   Pain Score 3    Pain Location Leg  Hip and knee   Pain Orientation Right   Pain  Descriptors / Indicators Dull;Aching   Pain Type Surgical pain   Pain Onset 1 to 4 weeks ago   Aggravating Factors  being up on on her feet too much   Pain Relieving Factors ice and pain medication   Effect of Pain on Daily Activities severe                         [facility] Adult PT Treatment/Exercise - 10/11/16 0001      Knee/Hip Exercises: Stretches   Active Hamstring Stretch Right;3 reps;30 seconds   Active Hamstring Stretch Limitations supine with sheet   Gastroc Stretch Both;3 reps;30 seconds   Gastroc Stretch Limitations slantboard      Knee/Hip Exercises: Standing   Heel Raises 15 reps   Heel Raises Limitations toe raises on incline slope   Terminal Knee Extension Limitations 20x3" holds    Rocker Board 2 minutes   Rocker Board Limitations DF/PF   Gait Training Gait training for heel to toe and equal stance phase to improve hip mechanics     Knee/Hip Exercises: Supine   Short Arc [employer] 15 reps   Short Arc Quad Sets Limitations 3# 5" holds   Straight Leg Raises Right;15 reps     Manual Therapy   Manual Therapy Soft tissue mobilization   Manual  therapy comments Separate rest of session   Soft tissue mobilization trigger point rectus femoris and vastus lateralis   Passive ROM 10 reps for knee extension supine position                  PT Short Term Goals - 10/09/16 1709      PT SHORT TERM GOAL #1   Title Patient to demonstrate R knee ROM as being 0-115 degrees in order to improve mechanics and reduce pain    Baseline 8/13- 5-115   Time 3   Period Weeks   Status Partially Met     PT SHORT TERM GOAL #2   Title Patient to be able to consistently ambulate with heel-toe pattern with LRAD, no increase in pain and unlimited distances, in order to improve general access to home and community    Baseline 8/13- continues to have intermittent difficulty   Time 3   Period Weeks   Status Partially Met     PT SHORT TERM GOAL #3   Title Patient  to experience pain R knee/LE as being no more than 5/10 in order to improve QOL and exercise tolerance    Baseline 8/13-  continues to require pain medicine    Time 3   Period Weeks   Status On-going     PT SHORT TERM GOAL #4   Title Patient to be compliant in correct performace of HEP, to be updated PRN    Baseline 8/13-  improving but still mixed compliance    Time 1   Period Weeks   Status Partially Met           PT Long Term Goals - 10/09/16 1712      PT LONG TERM GOAL #1   Title Pateint to demonstrate functional strength as being 5/5 in all tested groups in order to improve mechanics and activity tolerance    Baseline 8/13- hip extensors remain weak    Time 6   Period Weeks   Status Partially Met     PT LONG TERM GOAL #2   Title Patient to experience pain as being no more than 2/10 in order to improve QOL and facilitate return to work    Baseline 8/13- continues to require pain medicine    Time 6   Period Weeks   Status On-going     PT LONG TERM GOAL #3   Title Patient to be able to perform on-feet tasks for at least 4 hours with no increase in pain in order to facilitate ability to return to work    Baseline 8/13- unable    Time 6   Period Weeks   Status On-going     PT LONG TERM GOAL #4   Title Patient to be able to ambulate with no device unlimited distances and will also be able to reciprocally ascend/descent full flight of stairs with U railing and good eccentric control in order to improve home and community access    Baseline 8/13- ongoing    Time 6   Period Weeks   Status On-going               Plan - 10/11/16 1524    Clinical Impression Statement Session focus with therex to improve knee mobility and gait mechanics for pain control.  Verbal cueing to improve knee extension with heel strike and cueing for equal stance phase.  Pt given updated HEP to address knee extension lag and gluteal strengthening, able to demonstrate correct  form with all  updated exercises.  EOS with manual technique to address trigger points on quads.  EOS no reports of hip or knee pain.     Rehab Potential Good   Clinical Impairments Affecting Rehab Potential (+) success with PT for TKR in the past, motivated to participate, quick referral to rehab post-surgery   PT Frequency 2x / week   PT Duration 3 weeks   PT Treatment/Interventions ADLs/Self Care Home Management;Biofeedback;Cryotherapy;Moist Heat;DME Instruction;Gait training;Stair training;Functional mobility training;Therapeutic activities;Therapeutic exercise;Balance training;Neuromuscular re-education;Patient/family education;Manual techniques;Scar mobilization;Passive range of motion;Dry needling;Energy conservation   PT Next Visit Plan Continue to work on knee extension ROM, monitor knee flexion ROM and address as needed. Continue manual. Can progress to CKC exercise focus as ROM continues to normalize.    PT Home Exercise Plan Eval: quad sets, heel slides, lateral weight shifts, knee flexion stretch, knee extension stretch       Patient will benefit from skilled therapeutic intervention in order to improve the following deficits and impairments:  Abnormal gait, Decreased skin integrity, Increased fascial restricitons, Pain, Decreased coordination, Decreased mobility, Decreased scar mobility, Increased muscle spasms, Decreased activity tolerance, Decreased range of motion, Decreased strength, Hypomobility, Decreased balance, Difficulty walking, Increased edema, Impaired flexibility  Visit Diagnosis: Stiffness of right knee, not elsewhere classified  Muscle weakness (generalized)  Difficulty in walking, not elsewhere classified  Localized edema     Problem List Patient Active Problem List   Diagnosis Date Noted  . S/P right TKA 08/28/2016  . Primary osteoarthritis of left knee 01/01/2014  . Knee stiffness 01/01/2014  . Knee pain, left 01/01/2014  . Difficulty walking 01/01/2014  . S/P left  TKA 12/08/2013   Ihor Austin, [place]; [place]  Aldona Lento 10/11/2016, 3:27 PM  [facility] [address] [place], [place], [address] Phone: [phone]   Fax:  [phone]  Name: Teresa Charles MRN: [id number] Date of Birth: 19-Feb-1961

## 2016-10-16 ENCOUNTER — Ambulatory Visit (HOSPITAL_COMMUNITY): Payer: No Typology Code available for payment source | Admitting: Physical Therapy

## 2016-10-16 DIAGNOSIS — R262 Difficulty in walking, not elsewhere classified: Secondary | ICD-10-CM

## 2016-10-16 DIAGNOSIS — M6281 Muscle weakness (generalized): Secondary | ICD-10-CM

## 2016-10-16 DIAGNOSIS — R600 Localized edema: Secondary | ICD-10-CM

## 2016-10-16 DIAGNOSIS — M25661 Stiffness of right knee, not elsewhere classified: Secondary | ICD-10-CM | POA: Diagnosis not present

## 2016-10-16 NOTE — Therapy (Signed)
[facility] [address] [place], [place], [address] Phone: [phone]   Fax:  [phone]  Physical Therapy Treatment  Patient Details  Name: RAMI WADDLE MRN: [id number] Date of Birth: 1960-08-10 Referring Provider: Paralee Cancel   Encounter Date: 10/16/2016      PT End of Session - 10/16/16 1515    Visit Number 17   Number of Visits 19   Date for PT Re-Evaluation 11/01/16   Authorization Type [employer]    Authorization Time Period 10/01/00 to 7/74/12; recert done 8/78-08/04/6718   PT Start Time 1437   PT Stop Time 1515   PT Time Calculation (min) 38 min   Activity Tolerance Patient tolerated treatment well   Behavior During Therapy [facility] for tasks assessed/performed      Past Medical History:  Diagnosis Date  . Anxiety   . Arthritis   . Depression   . Dysrhythmia    palpitations  . Hypothyroidism     Past Surgical History:  Procedure Laterality Date  . CARDIAC CATHETERIZATION  2009  . TOTAL KNEE ARTHROPLASTY Left 12/08/2013   Procedure: LEFT TOTAL KNEE ARTHROPLASTY;  Surgeon: Mauri Pole, MD;  Location: [facility];  Service: Orthopedics;  Laterality: Left;  . TOTAL KNEE ARTHROPLASTY Right 08/28/2016   Procedure: RIGHT TOTAL KNEE ARTHROPLASTY;  Surgeon: Paralee Cancel, MD;  Location: [facility];  Service: Orthopedics;  Laterality: Right;    There were no vitals filed for this visit.      Subjective Assessment - 10/16/16 1438    Subjective Patient states she had a bad weekend pain wise but her MD is happy with her progress. She has felt a little "down" recently, no energy. Without her pain medicine her knee is very very paniful.    Pertinent History history L TKA, R TKA done 7/2, hypothyroidsm    Patient Stated Goals get back to work at the [employer]    Currently in Pain? Yes   Pain Score 3    Pain Location Knee   Pain Orientation Right            [facility] PT Assessment - 10/16/16 0001      AROM   Right Knee  Extension 4   Right Knee Flexion 118                     [facility] Adult PT Treatment/Exercise - 10/16/16 0001      Knee/Hip Exercises: Standing   Heel Raises 20 reps   Heel Raises Limitations heel and toe    Lateral Step Up Both;1 set;10 reps   Lateral Step Up Limitations 4 inch box U HHHA    Forward Step Up Both;1 set;10 reps   Forward Step Up Limitations 4 inch box, U HHA    Step Down Both;1 set;10 reps   Step Down Limitations 4 inch box U HHA      Manual Therapy   Manual Therapy Soft tissue mobilization;Joint mobilization;Edema management   Manual therapy comments Separate rest of session   Edema Management retro massage for edema control, LE elevated    Joint Mobilization patella mobility all directions focus on vertical tracking path    Soft tissue mobilization scar and vastus lateralis              Balance Exercises - 10/16/16 1506      Balance Exercises: Standing   Tandem Stance Eyes open;Foam/compliant surface;3 reps;20 secs   Rockerboard Lateral;EO;Other (comment)  2 min  Gait with Head Turns Forward;Retro;Other (comment);Other reps (comment)  inside parallel bars 3 full reps each            PT Education - 10/16/16 1515    Education provided Yes   Education Details POC moving forward   Person(s) Educated Patient   Methods Explanation   Comprehension Verbalized understanding          PT Short Term Goals - 10/09/16 1709      PT SHORT TERM GOAL #1   Title Patient to demonstrate R knee ROM as being 0-115 degrees in order to improve mechanics and reduce pain    Baseline 8/13- 5-115   Time 3   Period Weeks   Status Partially Met     PT SHORT TERM GOAL #2   Title Patient to be able to consistently ambulate with heel-toe pattern with LRAD, no increase in pain and unlimited distances, in order to improve general access to home and community    Baseline 8/13- continues to have intermittent difficulty   Time 3   Period Weeks   Status  Partially Met     PT SHORT TERM GOAL #3   Title Patient to experience pain R knee/LE as being no more than 5/10 in order to improve QOL and exercise tolerance    Baseline 8/13-  continues to require pain medicine    Time 3   Period Weeks   Status On-going     PT SHORT TERM GOAL #4   Title Patient to be compliant in correct performace of HEP, to be updated PRN    Baseline 8/13-  improving but still mixed compliance    Time 1   Period Weeks   Status Partially Met           PT Long Term Goals - 10/09/16 1712      PT LONG TERM GOAL #1   Title Pateint to demonstrate functional strength as being 5/5 in all tested groups in order to improve mechanics and activity tolerance    Baseline 8/13- hip extensors remain weak    Time 6   Period Weeks   Status Partially Met     PT LONG TERM GOAL #2   Title Patient to experience pain as being no more than 2/10 in order to improve QOL and facilitate return to work    Baseline 8/13- continues to require pain medicine    Time 6   Period Weeks   Status On-going     PT LONG TERM GOAL #3   Title Patient to be able to perform on-feet tasks for at least 4 hours with no increase in pain in order to facilitate ability to return to work    Baseline 8/13- unable    Time 6   Period Weeks   Status On-going     PT LONG TERM GOAL #4   Title Patient to be able to ambulate with no device unlimited distances and will also be able to reciprocally ascend/descent full flight of stairs with U railing and good eccentric control in order to improve home and community access    Baseline 8/13- ongoing    Time 6   Period Weeks   Status On-going               Plan - 10/16/16 1516    Clinical Impression Statement Patient arrives stating she had a rough weekend and is frustrated by her ongoing use of pain pills so far. Continued with manual techniques for  pain relief and ROM, however noting ROM of 4-118 this session. Otherwise worked on introduction of  functional CKC exercises due to improvements in ROM. Patient doing well moving forward at this time however note severe difficulty with eccentric control surgical LE, will require intense skilled focus moving forward.    Rehab Potential Good   Clinical Impairments Affecting Rehab Potential (+) success with PT for TKR in the past, motivated to participate, quick referral to rehab post-surgery   PT Frequency 2x / week   PT Duration 3 weeks   PT Treatment/Interventions ADLs/Self Care Home Management;Biofeedback;Cryotherapy;Moist Heat;DME Instruction;Gait training;Stair training;Functional mobility training;Therapeutic activities;Therapeutic exercise;Balance training;Neuromuscular re-education;Patient/family education;Manual techniques;Scar mobilization;Passive range of motion;Dry needling;Energy conservation   PT Next Visit Plan update HEP. Work on extension ROM but increase focus on CKC strength and balance. Mnaual PRN. Stair training.    PT Home Exercise Plan Eval: quad sets, heel slides, lateral weight shifts, knee flexion stretch, knee extension stretch    Consulted and Agree with Plan of Care Patient      Patient will benefit from skilled therapeutic intervention in order to improve the following deficits and impairments:  Abnormal gait, Decreased skin integrity, Increased fascial restricitons, Pain, Decreased coordination, Decreased mobility, Decreased scar mobility, Increased muscle spasms, Decreased activity tolerance, Decreased range of motion, Decreased strength, Hypomobility, Decreased balance, Difficulty walking, Increased edema, Impaired flexibility  Visit Diagnosis: Stiffness of right knee, not elsewhere classified  Muscle weakness (generalized)  Difficulty in walking, not elsewhere classified  Localized edema     Problem List Patient Active Problem List   Diagnosis Date Noted  . S/P right TKA 08/28/2016  . Primary osteoarthritis of left knee 01/01/2014  . Knee stiffness  01/01/2014  . Knee pain, left 01/01/2014  . Difficulty walking 01/01/2014  . S/P left TKA 12/08/2013    Deniece Ree PT, DPT [facility] [address] [place], [place], [address] Phone: [phone]   Fax:  [phone]  Name: CORRINE TILLIS MRN: [id number] Date of Birth: [birth year]

## 2016-10-18 ENCOUNTER — Ambulatory Visit (HOSPITAL_COMMUNITY): Payer: No Typology Code available for payment source | Admitting: Physical Therapy

## 2016-10-18 DIAGNOSIS — R262 Difficulty in walking, not elsewhere classified: Secondary | ICD-10-CM

## 2016-10-18 DIAGNOSIS — R600 Localized edema: Secondary | ICD-10-CM

## 2016-10-18 DIAGNOSIS — M25661 Stiffness of right knee, not elsewhere classified: Secondary | ICD-10-CM

## 2016-10-18 DIAGNOSIS — M6281 Muscle weakness (generalized): Secondary | ICD-10-CM

## 2016-10-18 NOTE — Therapy (Signed)
[facility] [address] [place], [place], [address] Phone: [phone]   Fax:  [phone]  Physical Therapy Treatment  Patient Details  Name: Teresa Charles MRN: [id number] Date of Birth: 30-Mar-1960 Referring Provider: Paralee Cancel   Encounter Date: 10/18/2016      PT End of Session - 10/18/16 1513    Visit Number 18   Number of Visits 19   Date for PT Re-Evaluation 11/01/16   Authorization Type [employer]    Authorization Time Period 09/28/97 to 3/71/69; recert done 6/78-10/30/8099   PT Start Time 1432   PT Stop Time 1511   PT Time Calculation (min) 39 min   Activity Tolerance Patient tolerated treatment well   Behavior During Therapy [facility] for tasks assessed/performed      Past Medical History:  Diagnosis Date  . Anxiety   . Arthritis   . Depression   . Dysrhythmia    palpitations  . Hypothyroidism     Past Surgical History:  Procedure Laterality Date  . CARDIAC CATHETERIZATION  2009  . TOTAL KNEE ARTHROPLASTY Left 12/08/2013   Procedure: LEFT TOTAL KNEE ARTHROPLASTY;  Surgeon: Mauri Pole, MD;  Location: [facility];  Service: Orthopedics;  Laterality: Left;  . TOTAL KNEE ARTHROPLASTY Right 08/28/2016   Procedure: RIGHT TOTAL KNEE ARTHROPLASTY;  Surgeon: Paralee Cancel, MD;  Location: [facility];  Service: Orthopedics;  Laterality: Right;    There were no vitals filed for this visit.      Subjective Assessment - 10/18/16 1434    Subjective Patient arrives stating she had a HA this morning and her knee still hurts but otherwise no major changes    Pertinent History history L TKA, R TKA done 7/2, hypothyroidsm    Patient Stated Goals get back to work at the [employer]    Currently in Pain? Yes   Pain Score 4    Pain Location Knee   Pain Orientation Right   Pain Descriptors / Indicators Aching   Pain Type Surgical pain   Pain Radiating Towards none    Pain Onset More than a month ago   Aggravating Factors  being up on  her feet too much    Pain Relieving Factors ice and medicine    Effect of Pain on Daily Activities severe                          [facility] Adult PT Treatment/Exercise - 10/18/16 0001      Knee/Hip Exercises: Stretches   Active Hamstring Stretch Right;3 reps;60 seconds   Active Hamstring Stretch Limitations 12 inch box    Knee: Self-Stretch to increase Flexion Right;10 seconds   Knee: Self-Stretch Limitations 15 reps      Knee/Hip Exercises: Standing   Heel Raises 20 reps   Heel Raises Limitations heel and toe    Terminal Knee Extension Limitations 20x3 second holds    Lateral Step Up Both;1 set;15 reps   Lateral Step Up Limitations 4 inch box U HHA    Forward Step Up Both;1 set;15 reps   Forward Step Up Limitations 4 inch box U HHA    Step Down Right;1 set;20 reps   Step Down Limitations 2 inch box U HHA   box height dropped due to form on 4 inch box      Knee/Hip Exercises: Seated   Sit to Sand without UE support;1 set;15 reps  eccentric stand to sit cues for form  Manual Therapy   Manual Therapy Other (comment)   Manual therapy comments Separate rest of session   Joint Mobilization patella mobility all directions    Soft tissue mobilization scar and vastus lateralis, rectus femoris    Passive ROM  knee extension supine position             Balance Exercises - 10/18/16 1455      Balance Exercises: Standing   Rockerboard Lateral;EO;Intermittent UE support;Other (comment);Anterior/posterior  x2 min each way            PT Education - 10/18/16 1512    Education provided Yes   Education Details schedule more appointments; desensitization techniques for scar area/anterior knee    Person(s) Educated Patient   Methods Explanation   Comprehension Verbalized understanding          PT Short Term Goals - 10/09/16 1709      PT SHORT TERM GOAL #1   Title Patient to demonstrate R knee ROM as being 0-115 degrees in order to improve mechanics and  reduce pain    Baseline 8/13- 5-115   Time 3   Period Weeks   Status Partially Met     PT SHORT TERM GOAL #2   Title Patient to be able to consistently ambulate with heel-toe pattern with LRAD, no increase in pain and unlimited distances, in order to improve general access to home and community    Baseline 8/13- continues to have intermittent difficulty   Time 3   Period Weeks   Status Partially Met     PT SHORT TERM GOAL #3   Title Patient to experience pain R knee/LE as being no more than 5/10 in order to improve QOL and exercise tolerance    Baseline 8/13-  continues to require pain medicine    Time 3   Period Weeks   Status On-going     PT SHORT TERM GOAL #4   Title Patient to be compliant in correct performace of HEP, to be updated PRN    Baseline 8/13-  improving but still mixed compliance    Time 1   Period Weeks   Status Partially Met           PT Long Term Goals - 10/09/16 1712      PT LONG TERM GOAL #1   Title Pateint to demonstrate functional strength as being 5/5 in all tested groups in order to improve mechanics and activity tolerance    Baseline 8/13- hip extensors remain weak    Time 6   Period Weeks   Status Partially Met     PT LONG TERM GOAL #2   Title Patient to experience pain as being no more than 2/10 in order to improve QOL and facilitate return to work    Baseline 8/13- continues to require pain medicine    Time 6   Period Weeks   Status On-going     PT LONG TERM GOAL #3   Title Patient to be able to perform on-feet tasks for at least 4 hours with no increase in pain in order to facilitate ability to return to work    Baseline 8/13- unable    Time 6   Period Weeks   Status On-going     PT LONG TERM GOAL #4   Title Patient to be able to ambulate with no device unlimited distances and will also be able to reciprocally ascend/descent full flight of stairs with U railing and good eccentric control in  order to improve home and community  access    Baseline 8/13- ongoing    Time 6   Period Weeks   Status On-going               Plan - 10/18/16 1513    Clinical Impression Statement Patient arrives reporting no major changes today besides a headache that she has taken pain medicine for; requested that she schedule more appointments per recommendations in last PT re-assessment. Focused today's session on ROM and strength based exercises and ended session with ongoing manual mostly for extension ROM. Patient continues to have considerable difficulty with eccentric strength however remains motivated to continue with skilled PT services.    Rehab Potential Good   Clinical Impairments Affecting Rehab Potential (+) success with PT for TKR in the past, motivated to participate, quick referral to rehab post-surgery   PT Frequency 2x / week   PT Duration 3 weeks   PT Treatment/Interventions ADLs/Self Care Home Management;Biofeedback;Cryotherapy;Moist Heat;DME Instruction;Gait training;Stair training;Functional mobility training;Therapeutic activities;Therapeutic exercise;Balance training;Neuromuscular re-education;Patient/family education;Manual techniques;Scar mobilization;Passive range of motion;Dry needling;Energy conservation   PT Next Visit Plan update HEP!! Continue CKC strength including eccentrics but continue manual especially for extension. Stairs.    PT Home Exercise Plan Eval: quad sets, heel slides, lateral weight shifts, knee flexion stretch, knee extension stretch    Consulted and Agree with Plan of Care Patient      Patient will benefit from skilled therapeutic intervention in order to improve the following deficits and impairments:  Abnormal gait, Decreased skin integrity, Increased fascial restricitons, Pain, Decreased coordination, Decreased mobility, Decreased scar mobility, Increased muscle spasms, Decreased activity tolerance, Decreased range of motion, Decreased strength, Hypomobility, Decreased balance,  Difficulty walking, Increased edema, Impaired flexibility  Visit Diagnosis: Stiffness of right knee, not elsewhere classified  Muscle weakness (generalized)  Difficulty in walking, not elsewhere classified  Localized edema     Problem List Patient Active Problem List   Diagnosis Date Noted  . S/P right TKA 08/28/2016  . Primary osteoarthritis of left knee 01/01/2014  . Knee stiffness 01/01/2014  . Knee pain, left 01/01/2014  . Difficulty walking 01/01/2014  . S/P left TKA 12/08/2013    Deniece Ree PT, DPT [facility] [address] [place], [place], [address] Phone: [phone]   Fax:  [phone]  Name: Teresa Charles MRN: [id number] Date of Birth: 11-07-1960

## 2016-10-23 ENCOUNTER — Ambulatory Visit (HOSPITAL_COMMUNITY): Payer: No Typology Code available for payment source

## 2016-10-23 ENCOUNTER — Telehealth (HOSPITAL_COMMUNITY): Payer: Self-pay | Admitting: Physician Assistant

## 2016-10-23 NOTE — Telephone Encounter (Signed)
10/23/16  pt left a message to cx but no reason given

## 2016-10-25 ENCOUNTER — Ambulatory Visit (HOSPITAL_COMMUNITY): Payer: No Typology Code available for payment source | Admitting: Physical Therapy

## 2016-10-25 DIAGNOSIS — M6281 Muscle weakness (generalized): Secondary | ICD-10-CM

## 2016-10-25 DIAGNOSIS — M25661 Stiffness of right knee, not elsewhere classified: Secondary | ICD-10-CM

## 2016-10-25 DIAGNOSIS — R262 Difficulty in walking, not elsewhere classified: Secondary | ICD-10-CM

## 2016-10-25 DIAGNOSIS — R600 Localized edema: Secondary | ICD-10-CM

## 2016-10-25 NOTE — Therapy (Signed)
[facility] [address] [place], [place], [address] Phone: [phone]   Fax:  [phone]  Physical Therapy Treatment  Patient Details  Name: Teresa Charles MRN: [id number] Date of Birth: 26-May-1960 Referring Provider: Paralee Cancel   Encounter Date: 10/25/2016      PT End of Session - 10/25/16 1521    Visit Number 19   Number of Visits 20   Date for PT Re-Evaluation 11/01/16   Authorization Type [employer]    Authorization Time Period 10/04/66 to 03/13/70; recert done 6/20-05/01/5972   PT Start Time 1436   PT Stop Time 1514   PT Time Calculation (min) 38 min   Activity Tolerance Patient tolerated treatment well   Behavior During Therapy [facility] for tasks assessed/performed      Past Medical History:  Diagnosis Date  . Anxiety   . Arthritis   . Depression   . Dysrhythmia    palpitations  . Hypothyroidism     Past Surgical History:  Procedure Laterality Date  . CARDIAC CATHETERIZATION  2009  . TOTAL KNEE ARTHROPLASTY Left 12/08/2013   Procedure: LEFT TOTAL KNEE ARTHROPLASTY;  Surgeon: Mauri Pole, MD;  Location: [facility];  Service: Orthopedics;  Laterality: Left;  . TOTAL KNEE ARTHROPLASTY Right 08/28/2016   Procedure: RIGHT TOTAL KNEE ARTHROPLASTY;  Surgeon: Paralee Cancel, MD;  Location: [facility];  Service: Orthopedics;  Laterality: Right;    There were no vitals filed for this visit.      Subjective Assessment - 10/25/16 1439    Subjective Patient arrives stating things are just rough right now, her dog is sick and her life is just crazy    Pertinent History history L TKA, R TKA done 7/2, hypothyroidsm    Patient Stated Goals get back to work at the [employer]    Currently in Pain? Yes   Pain Score 3    Pain Location Knee   Pain Orientation Right   Pain Descriptors / Indicators Aching   Pain Type Surgical pain   Pain Radiating Towards none    Pain Onset More than a month ago   Pain Frequency Constant   Aggravating  Factors  being up on feet too much    Pain Relieving Factors ice and medicine    Effect of Pain on Daily Activities moderate                          [facility] Adult PT Treatment/Exercise - 10/25/16 0001      Knee/Hip Exercises: Standing   Heel Raises Both;1 set;15 reps   Heel Raises Limitations U heel raises, toe raises off incline    Knee Flexion Both;1 set;15 reps   Knee Flexion Limitations 3#, 3 second holds    Hip Flexion Both;1 set;15 reps   Hip Flexion Limitations 3#    Lateral Step Up Both;1 set;15 reps   Lateral Step Up Limitations 6 inch box no HHA    Forward Step Up Both;1 set;15 reps   Forward Step Up Limitations 6 inch box no HHA    Functional Squat 1 set;10 reps   Functional Squat Limitations mini squat, cues for form    Other Standing Knee Exercises hip hikes with and without swing 2x10 B              Balance Exercises - 10/25/16 1459      Balance Exercises: Standing   Standing Eyes Opened Narrow base of support (  BOS);Foam/compliant surface;4 reps;30 secs  body blade    Tandem Stance Eyes open;Foam/compliant surface;3 reps  head turns    Rockerboard Anterior/posterior;Lateral;EO;Intermittent UE support;Other (comment)  x2 min each way    Tandem Gait 3 reps;Forward;Retro;Intermittent upper extremity support;Other (comment)  in parallel bars    Other Standing Exercises UE cone rotations on foam in tandem stance x2 each side            PT Education - 10/25/16 1521    Education provided No          PT Short Term Goals - 10/09/16 1709      PT SHORT TERM GOAL #1   Title Patient to demonstrate R knee ROM as being 0-115 degrees in order to improve mechanics and reduce pain    Baseline 8/13- 5-115   Time 3   Period Weeks   Status Partially Met     PT SHORT TERM GOAL #2   Title Patient to be able to consistently ambulate with heel-toe pattern with LRAD, no increase in pain and unlimited distances, in order to improve general access to  home and community    Baseline 8/13- continues to have intermittent difficulty   Time 3   Period Weeks   Status Partially Met     PT SHORT TERM GOAL #3   Title Patient to experience pain R knee/LE as being no more than 5/10 in order to improve QOL and exercise tolerance    Baseline 8/13-  continues to require pain medicine    Time 3   Period Weeks   Status On-going     PT SHORT TERM GOAL #4   Title Patient to be compliant in correct performace of HEP, to be updated PRN    Baseline 8/13-  improving but still mixed compliance    Time 1   Period Weeks   Status Partially Met           PT Long Term Goals - 10/09/16 1712      PT LONG TERM GOAL #1   Title Pateint to demonstrate functional strength as being 5/5 in all tested groups in order to improve mechanics and activity tolerance    Baseline 8/13- hip extensors remain weak    Time 6   Period Weeks   Status Partially Met     PT LONG TERM GOAL #2   Title Patient to experience pain as being no more than 2/10 in order to improve QOL and facilitate return to work    Baseline 8/13- continues to require pain medicine    Time 6   Period Weeks   Status On-going     PT LONG TERM GOAL #3   Title Patient to be able to perform on-feet tasks for at least 4 hours with no increase in pain in order to facilitate ability to return to work    Baseline 8/13- unable    Time 6   Period Weeks   Status On-going     PT LONG TERM GOAL #4   Title Patient to be able to ambulate with no device unlimited distances and will also be able to reciprocally ascend/descent full flight of stairs with U railing and good eccentric control in order to improve home and community access    Baseline 8/13- ongoing    Time 6   Period Weeks   Status On-going               Plan - 10/25/16 1522    Clinical  Impression Statement Continued with functional strength and balance work today as tolerated by patient. Patient continues to remain concerned  regarding soreness and achiness in her knee despite her surgery only being earlier this summer. Recommend re-assessment within next 1-2 upcoming sessions to assess progress and skilled PT needs moving forward.    Rehab Potential Good   Clinical Impairments Affecting Rehab Potential (+) success with PT for TKR in the past, motivated to participate, quick referral to rehab post-surgery   PT Frequency 2x / week   PT Duration 3 weeks   PT Treatment/Interventions ADLs/Self Care Home Management;Biofeedback;Cryotherapy;Moist Heat;DME Instruction;Gait training;Stair training;Functional mobility training;Therapeutic activities;Therapeutic exercise;Balance training;Neuromuscular re-education;Patient/family education;Manual techniques;Scar mobilization;Passive range of motion;Dry needling;Energy conservation   PT Next Visit Plan re-assess    PT Home Exercise Plan Eval: quad sets, heel slides, lateral weight shifts, knee flexion stretch, knee extension stretch    Consulted and Agree with Plan of Care Patient      Patient will benefit from skilled therapeutic intervention in order to improve the following deficits and impairments:  Abnormal gait, Decreased skin integrity, Increased fascial restricitons, Pain, Decreased coordination, Decreased mobility, Decreased scar mobility, Increased muscle spasms, Decreased activity tolerance, Decreased range of motion, Decreased strength, Hypomobility, Decreased balance, Difficulty walking, Increased edema, Impaired flexibility  Visit Diagnosis: Stiffness of right knee, not elsewhere classified  Muscle weakness (generalized)  Difficulty in walking, not elsewhere classified  Localized edema     Problem List Patient Active Problem List   Diagnosis Date Noted  . S/P right TKA 08/28/2016  . Primary osteoarthritis of left knee 01/01/2014  . Knee stiffness 01/01/2014  . Knee pain, left 01/01/2014  . Difficulty walking 01/01/2014  . S/P left TKA 12/08/2013     Deniece Ree PT, DPT [facility] [address] [place], [place], [address] Phone: [phone]   Fax:  [phone]  Name: Teresa Charles MRN: [id number] Date of Birth: 1960/12/19

## 2016-10-31 ENCOUNTER — Ambulatory Visit (HOSPITAL_COMMUNITY): Payer: No Typology Code available for payment source | Attending: Orthopedic Surgery

## 2016-10-31 DIAGNOSIS — R262 Difficulty in walking, not elsewhere classified: Secondary | ICD-10-CM | POA: Diagnosis present

## 2016-10-31 DIAGNOSIS — M6281 Muscle weakness (generalized): Secondary | ICD-10-CM | POA: Diagnosis present

## 2016-10-31 DIAGNOSIS — M25661 Stiffness of right knee, not elsewhere classified: Secondary | ICD-10-CM | POA: Insufficient documentation

## 2016-10-31 DIAGNOSIS — R600 Localized edema: Secondary | ICD-10-CM | POA: Diagnosis present

## 2016-10-31 NOTE — Therapy (Signed)
[place], [place], [address] Phone: [phone]   Fax:  [phone]  Physical Therapy Reassessment   Patient Details  Name: Teresa Charles MRN: [id number] Date of Birth: 20-Apr-1960 Referring Provider: Paralee Cancel   Encounter Date: 10/31/2016      PT End of Session - 10/31/16 1616    Visit Number 20   Number of Visits 23   Date for PT Re-Evaluation 11/28/16   Authorization Type [employer]    Authorization Time Period 04/08/91 to 09/12/94; recert done 7/89-05/05/1015; recert done 5/1-02/5   PT Start Time 1438   PT Stop Time 1514   PT Time Calculation (min) 36 min   Activity Tolerance Patient limited by pain   Behavior During Therapy [facility] for tasks assessed/performed      Past Medical History:  Diagnosis Date  . Anxiety   . Arthritis   . Depression   . Dysrhythmia    palpitations  . Hypothyroidism     Past Surgical History:  Procedure Laterality Date  . CARDIAC CATHETERIZATION  2009  . TOTAL KNEE ARTHROPLASTY Left 12/08/2013   Procedure: LEFT TOTAL KNEE ARTHROPLASTY;  Surgeon: Mauri Pole, MD;  Location: [facility];  Service: Orthopedics;  Laterality: Left;  . TOTAL KNEE ARTHROPLASTY Right 08/28/2016   Procedure: RIGHT TOTAL KNEE ARTHROPLASTY;  Surgeon: Paralee Cancel, MD;  Location: [facility];  Service: Orthopedics;  Laterality: Right;    There were no vitals filed for this visit.       Subjective Assessment - 10/31/16 1444    Subjective Pt arrives late, ready for reassessmen today. Pt reports she is 75% back to her baseline fucnitonal leve. She is still most limited by poor tolerance3 to standing activity, with increased burning pain in the Rt knee. She says this is very different from her last TKA. She is anxious to get back to work, but feel slike she is still limited.    Pertinent History history L TKA, R TKA done 7/2, hypothyroidsm    How long can you sit comfortably? 9/4: pt has no liitatiosn for her  meaningful tasks;  7/25- fairly unlimited    How long can you stand comfortably? 9/4: estiamted 30-45 minutes.    How long can you walk comfortably? 9/4: up to 1 hour without a problem, but was hurting thereafter.    Patient Stated Goals get back to work at the [employer]    Currently in Pain? Yes   Pain Score 4    Pain Location Knee   Pain Orientation Right   Pain Descriptors / Indicators Aching   Pain Type Surgical pain   Pain Onset More than a month ago   Pain Frequency Constant   Aggravating Factors  walking, standing   Pain Relieving Factors pain meds            [facility] PT Assessment - 10/31/16 0001      Assessment   Medical Diagnosis R TKR    Referring Provider Paralee Cancel    Onset Date/Surgical Date 08/28/16   Next MD Visit Dr. Alvan Dame, September 26   Prior Therapy PT for her L knee 3 years ago      Balance Screen   Has the patient fallen in the past 6 months Yes   How many times? 2-3   Has the patient had a decrease in activity level because of a fear of falling?  Yes   Is the patient reluctant to leave their  home because of a fear of falling?  No     AROM   Right Knee Extension 14   Right Knee Flexion 119     Strength   Right Hip Flexion 5/5   Right Hip Extension 5/5   Right Hip External Rotation  4+/5  limited medial knee pain   Right Hip Internal Rotation 5/5   Right Hip ABduction 5/5   Left Hip Flexion 5/5   Left Hip Extension 5/5   Left Hip External Rotation 5/5   Left Hip Internal Rotation 5/5   Left Hip ABduction 5/5   Right Knee Flexion 5/5   Right Knee Extension 5/5   Left Knee Flexion 5/5   Left Knee Extension 5/5     Ambulation/Gait   Ambulation Distance (Feet) 675 Feet   Assistive device None   Gait velocity 1.50ms   Gait Comments Rt compensated trendelenburg,   Lt hard abrupt supination in mid staqnce            Objective measurements completed on examination: See above findings.                    PT Short  Term Goals - 10/31/16 1504      PT SHORT TERM GOAL #1   Title Patient to demonstrate R knee ROM as being 0-115 degrees in order to improve mechanics and reduce pain    Baseline 14-119 degrees 9/4   Time 3   Period Weeks   Status Partially Met     PT SHORT TERM GOAL #2   Title Patient to be able to consistently ambulate with heel-toe pattern with LRAD, no increase in pain and unlimited distances, in order to improve general access to home and community    Baseline still limited by pain, but speed and distance improving    Time 3   Period Weeks   Status On-going     PT SHORT TERM GOAL #3   Title Patient to experience pain R knee/LE as being no more than 5/10 in order to improve QOL and exercise tolerance    Baseline 8/13-  continues to require pain medicine    Time 3   Period Weeks   Status Achieved     PT SHORT TERM GOAL #4   Title Patient to be compliant in correct performace of HEP, to be updated PRN    Baseline 8/13-  improving but still mixed compliance    Time 1   Period Weeks   Status Achieved           PT Long Term Goals - 10/31/16 1506      PT LONG TERM GOAL #1   Title Pateint to demonstrate functional strength as being 5/5 in all tested groups in order to improve mechanics and activity tolerance    Time 6   Period Weeks   Status Achieved     PT LONG TERM GOAL #2   Title Patient to experience pain as being no more than 2/10 in order to improve QOL and facilitate return to work    Baseline remains a 4/10 most of the time.    Time 6   Period Weeks   Status On-going     PT LONG TERM GOAL #3   Title Patient to be able to perform on-feet tasks for at least 4 hours with no increase in pain in order to facilitate ability to return to work    Baseline 9/4- unable    Status  On-going     PT LONG TERM GOAL #4   Title Patient to be able to ambulate with no device unlimited distances and will also be able to reciprocally ascend/descent full flight of stairs with U  railing and good eccentric control in order to improve home and community access    Baseline 9/4- ongoing motivated, but limited still due to pain   Status Not Met     PT LONG TERM GOAL #5   Title Patient will be able to squat and pivot while lifting 50lb to be able to return to work.    Baseline currently unable due to balance and weakness   Time 4   Period Weeks   Status Not Met                Plan - 10/31/16 1511    Clinical Impression Statement PT reassessment performed this date. Partial goal completion noted, with continued limitations from pain, effusion, and decreased activity tolerance. Strength continues to to improve, hips and knees grossly 5/5 as of today's visit. AMB cotninues to appear mechnically unsteady with Right sided comepnsated trendelenburg, and some some vaulting on the left side, which may be owrse today d/t decreased Rt knee extenion ROM. ROM has been 4-118 degrees the past several visits, but today is at 14 degrees extension, unclear if this is acutely changed, or interrater reliability is limited. Pt iseducated on more concise completion of HEP, independent of other ADL, and not performing continuous stretching/strengthening throghout the day. She is encouraged to walk frequently, but liimiting distances that are do not impede function the following day. Going fowrward focus will strongly remain on gait training, developing good habits at homewithout over exerting the knee, and continued pursuance of knee extension ROM, all in order to improve functional activity tolerance, for eventual return to work.    Rehab Potential Good   Clinical Impairments Affecting Rehab Potential (+) success with PT for TKR in the past, motivated to participate, quick referral to rehab post-surgery   PT Frequency 1x / week   PT Duration 4 weeks   PT Treatment/Interventions ADLs/Self Care Home Management;Biofeedback;Cryotherapy;Moist Heat;DME Instruction;Gait training;Stair  training;Functional mobility training;Therapeutic activities;Therapeutic exercise;Balance training;Neuromuscular re-education;Patient/family education;Manual techniques;Scar mobilization;Passive range of motion;Dry needling;Energy conservation   PT Next Visit Plan myofascial assessment of quads, assessment of patella mobility, revisit HEP and stretchingroutine with patient to avoid excessive stretching and inappropriate activity with ladder and stairs.    PT Home Exercise Plan Eval: quad sets, heel slides, lateral weight shifts, knee flexion stretch, knee extension stretch    Consulted and Agree with Plan of Care Patient      Patient will benefit from skilled therapeutic intervention in order to improve the following deficits and impairments:  Abnormal gait, Decreased skin integrity, Increased fascial restricitons, Pain, Decreased coordination, Decreased mobility, Decreased scar mobility, Increased muscle spasms, Decreased activity tolerance, Decreased range of motion, Decreased strength, Hypomobility, Decreased balance, Difficulty walking, Increased edema, Impaired flexibility  Visit Diagnosis: Stiffness of right knee, not elsewhere classified - Plan: PT plan of care cert/re-cert  Muscle weakness (generalized) - Plan: PT plan of care cert/re-cert  Difficulty in walking, not elsewhere classified - Plan: PT plan of care cert/re-cert  Localized edema - Plan: PT plan of care cert/re-cert     Problem List Patient Active Problem List   Diagnosis Date Noted  . S/P right TKA 08/28/2016  . Primary osteoarthritis of left knee 01/01/2014  . Knee stiffness 01/01/2014  . Knee pain, left 01/01/2014  .  Difficulty walking 01/01/2014  . S/P left TKA 12/08/2013   4:22 PM, 10/31/16 Etta Grandchild, PT, DPT Physical Therapist - [place] [phone] (Office)    Etta Grandchild 10/31/2016, 4:21 PM  [place], [place], [address] Phone: [phone]   Fax:  [phone]  Name: Teresa Charles MRN: [id number] Date of Birth: [birth year]

## 2016-11-02 ENCOUNTER — Ambulatory Visit (HOSPITAL_COMMUNITY): Payer: No Typology Code available for payment source | Admitting: Physical Therapy

## 2016-11-02 ENCOUNTER — Telehealth (HOSPITAL_COMMUNITY): Payer: Self-pay | Admitting: Physical Therapy

## 2016-11-02 NOTE — Telephone Encounter (Signed)
Pt had a tooth pulled today and can not come in

## 2016-11-07 ENCOUNTER — Ambulatory Visit (HOSPITAL_COMMUNITY): Payer: No Typology Code available for payment source | Admitting: Physical Therapy

## 2016-11-07 ENCOUNTER — Telehealth (HOSPITAL_COMMUNITY): Payer: Self-pay | Admitting: Physician Assistant

## 2016-11-07 DIAGNOSIS — R600 Localized edema: Secondary | ICD-10-CM

## 2016-11-07 DIAGNOSIS — M6281 Muscle weakness (generalized): Secondary | ICD-10-CM

## 2016-11-07 DIAGNOSIS — R262 Difficulty in walking, not elsewhere classified: Secondary | ICD-10-CM

## 2016-11-07 DIAGNOSIS — M25661 Stiffness of right knee, not elsewhere classified: Secondary | ICD-10-CM

## 2016-11-07 NOTE — Therapy (Signed)
[place], [place], [address] Phone: [phone]   Fax:  [phone]  Physical Therapy Treatment  Patient Details  Name: Teresa Charles MRN: [id number] Date of Birth: 1960-08-16 Referring Provider: Paralee Cancel   Encounter Date: 11/07/2016      PT End of Session - 11/07/16 1607    Visit Number 21   Number of Visits 23   Date for PT Re-Evaluation 11/28/16   Authorization Type [employer]    Authorization Time Period 07/29/68 to 3/50/09; recert done 3/81-09/29/9935; recert done 1/6-96/7   PT Start Time 1520   PT Stop Time 1600   PT Time Calculation (min) 40 min   Activity Tolerance Patient limited by pain   Behavior During Therapy [facility] for tasks assessed/performed      Past Medical History:  Diagnosis Date  . Anxiety   . Arthritis   . Depression   . Dysrhythmia    palpitations  . Hypothyroidism     Past Surgical History:  Procedure Laterality Date  . CARDIAC CATHETERIZATION  2009  . TOTAL KNEE ARTHROPLASTY Left 12/08/2013   Procedure: LEFT TOTAL KNEE ARTHROPLASTY;  Surgeon: Mauri Pole, MD;  Location: [facility];  Service: Orthopedics;  Laterality: Left;  . TOTAL KNEE ARTHROPLASTY Right 08/28/2016   Procedure: RIGHT TOTAL KNEE ARTHROPLASTY;  Surgeon: Paralee Cancel, MD;  Location: [facility];  Service: Orthopedics;  Laterality: Right;    There were no vitals filed for this visit.      Subjective Assessment - 11/07/16 1522    Subjective Pt states she woke and her knee felt back to normal.  After moving around a coupld hours, the pain returned and she had to take a pain pill.  Currently with 3/10 after pain meds, 5/10 prior to .  Reports it aches when she has it straight and feels better slightly bent.   Currently in Pain? Yes   Pain Score 3    Pain Location Knee   Pain Orientation Right   Pain Descriptors / Indicators Aching   Pain Type Surgical pain                         [facility] Adult  PT Treatment/Exercise - 11/07/16 0001      Knee/Hip Exercises: Standing   Stairs 4Rt reciprocally with 1 HR assist 6" steps   SLS Lt: 30", Rt: 11" max of 4 trials   SLS with Vectors Rt 5X5" holds   Other Standing Knee Exercises foam balance beam 1RT tandem forward and retro     Knee/Hip Exercises: Seated   Sit to Sand without UE support;1 set;15 reps     Knee/Hip Exercises: Supine   Quad Sets 15 reps   Short Arc Quad Sets 15 reps   Knee Extension Limitations 8   Knee Flexion Limitations 120     Manual Therapy   Manual Therapy Myofascial release   Manual therapy comments Separate rest of session   Myofascial Release perimeter of knee including posterior knee in supine and prone to decrease adhesions   Passive ROM  knee extension supine position                  PT Short Term Goals - 10/31/16 1504      PT SHORT TERM GOAL #1   Title Patient to demonstrate R knee ROM as being 0-115 degrees in order to improve mechanics and reduce pain    Baseline  14-119 degrees 9/4   Time 3   Period Weeks   Status Partially Met     PT SHORT TERM GOAL #2   Title Patient to be able to consistently ambulate with heel-toe pattern with LRAD, no increase in pain and unlimited distances, in order to improve general access to home and community    Baseline still limited by pain, but speed and distance improving    Time 3   Period Weeks   Status On-going     PT SHORT TERM GOAL #3   Title Patient to experience pain R knee/LE as being no more than 5/10 in order to improve QOL and exercise tolerance    Baseline 8/13-  continues to require pain medicine    Time 3   Period Weeks   Status Achieved     PT SHORT TERM GOAL #4   Title Patient to be compliant in correct performace of HEP, to be updated PRN    Baseline 8/13-  improving but still mixed compliance    Time 1   Period Weeks   Status Achieved           PT Long Term Goals - 10/31/16 1506      PT LONG TERM GOAL #1   Title  Pateint to demonstrate functional strength as being 5/5 in all tested groups in order to improve mechanics and activity tolerance    Time 6   Period Weeks   Status Achieved     PT LONG TERM GOAL #2   Title Patient to experience pain as being no more than 2/10 in order to improve QOL and facilitate return to work    Baseline remains a 4/10 most of the time.    Time 6   Period Weeks   Status On-going     PT LONG TERM GOAL #3   Title Patient to be able to perform on-feet tasks for at least 4 hours with no increase in pain in order to facilitate ability to return to work    Baseline 9/4- unable    Status On-going     PT LONG TERM GOAL #4   Title Patient to be able to ambulate with no device unlimited distances and will also be able to reciprocally ascend/descent full flight of stairs with U railing and good eccentric control in order to improve home and community access    Baseline 9/4- ongoing motivated, but limited still due to pain   Status Not Met     PT LONG TERM GOAL #5   Title Patient will be able to squat and pivot while lifting 50lb to be able to return to work.    Baseline currently unable due to balance and weakness   Time 4   Period Weeks   Status Not Met               Plan - 11/07/16 1608    Clinical Impression Statement Began session with manual as with noted myofascial restrictions and pain voiced per patient.  Noted adhesions perimeter of knee, including posterior that responded well to myofascial techniques.  Patellar mobility was good without noted restrictions.  continued with work on extension, completing quad sets and SAQ with remeasured motion today of 8 degrees active extension.  PROM completed with approx 3 degrees obtained.  Pt encoruaged to continue her knee hangs at home to improve extension.  Began balance activities to improve gait and stability.  Noted difference with inabiltiy to complete greater than 11  seconds SLS on Rt as compared to 30 plus  seconds on Lt LE.  vector stance added to help strengthen glute and LE stablity.  andem gait/retro gait was not a challenge until added on unlevel surface.  Pt reported no increase in pain at end of session.     Rehab Potential Good   Clinical Impairments Affecting Rehab Potential (+) success with PT for TKR in the past, motivated to participate, quick referral to rehab post-surgery   PT Frequency 1x / week   PT Duration 4 weeks   PT Treatment/Interventions ADLs/Self Care Home Management;Biofeedback;Cryotherapy;Moist Heat;DME Instruction;Gait training;Stair training;Functional mobility training;Therapeutic activities;Therapeutic exercise;Balance training;Neuromuscular re-education;Patient/family education;Manual techniques;Scar mobilization;Passive range of motion;Dry needling;Energy conservation   PT Next Visit Plan continue with manual to reduce adhesions, ROM to improve extension and LE stability exercises.  Normalize gait.    PT Home Exercise Plan Eval: quad sets, heel slides, lateral weight shifts, knee flexion stretch, knee extension stretch    Consulted and Agree with Plan of Care Patient      Patient will benefit from skilled therapeutic intervention in order to improve the following deficits and impairments:  Abnormal gait, Decreased skin integrity, Increased fascial restricitons, Pain, Decreased coordination, Decreased mobility, Decreased scar mobility, Increased muscle spasms, Decreased activity tolerance, Decreased range of motion, Decreased strength, Hypomobility, Decreased balance, Difficulty walking, Increased edema, Impaired flexibility  Visit Diagnosis: Stiffness of right knee, not elsewhere classified  Muscle weakness (generalized)  Difficulty in walking, not elsewhere classified  Localized edema     Problem List Patient Active Problem List   Diagnosis Date Noted  . S/P right TKA 08/28/2016  . Primary osteoarthritis of left knee 01/01/2014  . Knee stiffness 01/01/2014   . Knee pain, left 01/01/2014  . Difficulty walking 01/01/2014  . S/P left TKA 12/08/2013   Teresa Charles, Teresa Charles  Teresa Charles 11/07/2016, 4:19 PM  [place] [address] [place], [place], [address] Phone: [phone]   Fax:  [phone]  Name: KINZEE HAPPEL MRN: [id number] Date of Birth: 1960/07/10

## 2016-11-07 NOTE — Telephone Encounter (Signed)
9/11  11:24 left a message to ask patient if she would come in at 3:15 [facility] 4:[facility] he is going over to hospital to help out.   MM

## 2016-11-09 ENCOUNTER — Encounter (HOSPITAL_COMMUNITY): Payer: Self-pay | Admitting: Physical Therapy

## 2016-11-09 ENCOUNTER — Ambulatory Visit (HOSPITAL_COMMUNITY): Payer: No Typology Code available for payment source | Admitting: Physical Therapy

## 2016-11-09 DIAGNOSIS — M6281 Muscle weakness (generalized): Secondary | ICD-10-CM

## 2016-11-09 DIAGNOSIS — R262 Difficulty in walking, not elsewhere classified: Secondary | ICD-10-CM

## 2016-11-09 DIAGNOSIS — M25661 Stiffness of right knee, not elsewhere classified: Secondary | ICD-10-CM | POA: Diagnosis not present

## 2016-11-09 DIAGNOSIS — R600 Localized edema: Secondary | ICD-10-CM

## 2016-11-09 NOTE — Patient Instructions (Signed)
Pt encouraged to place L foot forward at home throughout day to improve R LE weight bearing during STS transfer to improve R quadricep strength. Demonstration and verbal explanation given with verbal understanding and patient independent demonstration provided end of session.

## 2016-11-09 NOTE — Therapy (Signed)
[place], [place], [address] Phone: [phone]   Fax:  [phone]  Physical Therapy Treatment  Patient Details  Name: Teresa Charles MRN: [id number] Date of Birth: [birth year] Referring Provider: Paralee Cancel   Encounter Date: 11/09/2016      PT End of Session - 11/09/16 1605    Visit Number 22   Number of Visits 23   Date for PT Re-Evaluation 11/28/16   Authorization Type [employer]    Authorization Time Period 05/04/73 to 6/43/32; recert done 9/51-10/04/4164; recert done 0/6-30/1   PT Start Time 1515   PT Stop Time 1555   PT Time Calculation (min) 40 min   Activity Tolerance Patient limited by pain   Behavior During Therapy [facility] for tasks assessed/performed      Past Medical History:  Diagnosis Date  . Anxiety   . Arthritis   . Depression   . Dysrhythmia    palpitations  . Hypothyroidism     Past Surgical History:  Procedure Laterality Date  . CARDIAC CATHETERIZATION  2009  . TOTAL KNEE ARTHROPLASTY Left 12/08/2013   Procedure: LEFT TOTAL KNEE ARTHROPLASTY;  Surgeon: Mauri Pole, MD;  Location: [facility];  Service: Orthopedics;  Laterality: Left;  . TOTAL KNEE ARTHROPLASTY Right 08/28/2016   Procedure: RIGHT TOTAL KNEE ARTHROPLASTY;  Surgeon: Paralee Cancel, MD;  Location: [facility];  Service: Orthopedics;  Laterality: Right;    There were no vitals filed for this visit.      Subjective Assessment - 11/09/16 1621    Subjective Pt states today her R knee is feeling really sore today and yesterday she had the worst day she has had in awhile. She is unsure what caused the increased irritation. She denies taking pain meds today secondary to having to drive herself. Current pain 3-4/10.    Pertinent History history L TKA, R TKA done 7/2, hypothyroidsm    Limitations Walking   How long can you sit comfortably? unlimited   How long can you walk comfortably? up to 1 hour without a problem on better days  slightly longer, then requires a break   Patient Stated Goals get back to work at the [employer], be able to walk longer distance without having to take a break                         [facility] Adult PT Treatment/Exercise - 11/09/16 1532      Ambulation/Gait   Assistive device None     Knee/Hip Exercises: Standing   SLS Rt. 2x15 sec limited secondary to pain mod difficulty    Other Standing Knee Exercises Active knee flexion on stairs 10s hold x 3 reps   Other Standing Knee Exercises Foam marching 3 count for SL balance and proprioception bilaterally      Knee/Hip Exercises: Seated   Sit to Sand without UE support;2 sets;5 reps     Knee/Hip Exercises: Supine   Quad Sets 15 reps   Short Arc [employer] 15 reps   Bridges with [employer] 10 reps   Patellar Mobs Medial/lateral and superior/inferior sev limitations   Other Supine Knee/Hip Exercises Supine clamshell Green Theraband x 10 reps      Manual Therapy   Manual Therapy Joint mobilization;Soft tissue mobilization;Scapular mobilization;Passive ROM   Manual therapy comments Separate rest of session   Myofascial Release perimeter of knee including posterior knee in supine and prone to decrease adhesions  Passive ROM  knee flexion & extension supine position                PT Education - 11/09/16 1605    Education provided No   Person(s) Educated Patient   Methods Explanation   Comprehension Verbalized understanding          PT Short Term Goals - 10/31/16 1504      PT SHORT TERM GOAL #1   Title Patient to demonstrate R knee ROM as being 0-115 degrees in order to improve mechanics and reduce pain    Baseline 14-119 degrees 9/4   Time 3   Period Weeks   Status Partially Met     PT SHORT TERM GOAL #2   Title Patient to be able to consistently ambulate with heel-toe pattern with LRAD, no increase in pain and unlimited distances, in order to improve general access to home and community    Baseline  still limited by pain, but speed and distance improving    Time 3   Period Weeks   Status On-going     PT SHORT TERM GOAL #3   Title Patient to experience pain R knee/LE as being no more than 5/10 in order to improve QOL and exercise tolerance    Baseline 8/13-  continues to require pain medicine    Time 3   Period Weeks   Status Achieved     PT SHORT TERM GOAL #4   Title Patient to be compliant in correct performace of HEP, to be updated PRN    Baseline 8/13-  improving but still mixed compliance    Time 1   Period Weeks   Status Achieved           PT Long Term Goals - 10/31/16 1506      PT LONG TERM GOAL #1   Title Pateint to demonstrate functional strength as being 5/5 in all tested groups in order to improve mechanics and activity tolerance    Time 6   Period Weeks   Status Achieved     PT LONG TERM GOAL #2   Title Patient to experience pain as being no more than 2/10 in order to improve QOL and facilitate return to work    Baseline remains a 4/10 most of the time.    Time 6   Period Weeks   Status On-going     PT LONG TERM GOAL #3   Title Patient to be able to perform on-feet tasks for at least 4 hours with no increase in pain in order to facilitate ability to return to work    Baseline 9/4- unable    Status On-going     PT LONG TERM GOAL #4   Title Patient to be able to ambulate with no device unlimited distances and will also be able to reciprocally ascend/descent full flight of stairs with U railing and good eccentric control in order to improve home and community access    Baseline 9/4- ongoing motivated, but limited still due to pain   Status Not Met     PT LONG TERM GOAL #5   Title Patient will be able to squat and pivot while lifting 50lb to be able to return to work.    Baseline currently unable due to balance and weakness   Time 4   Period Weeks   Status Not Met               Plan - 11/09/16 1606  Clinical Impression Statement Patient  had increased restrictions noted posterior R knee and superior scar surroundings. Pt continues to present with decreased ROM of the R knee with reports of more discomfort in knee flexion. Pt had slight R medial knee discomfort during bridge exercise that decreased with resistance moved towards the hip to decrease resistance to work against. Pt continues to have significant limitations of single leg balance Rt LE. Pt reported no increase in pain at end of session.    Rehab Potential Good   Clinical Impairments Affecting Rehab Potential (+) success with PT for TKR in the past, motivated to participate, quick referral to rehab post-surgery   PT Frequency 1x / week   PT Duration 4 weeks   PT Treatment/Interventions ADLs/Self Care Home Management;Biofeedback;Cryotherapy;Moist Heat;DME Instruction;Gait training;Stair training;Functional mobility training;Therapeutic activities;Therapeutic exercise;Balance training;Neuromuscular re-education;Patient/family education;Manual techniques;Scar mobilization;Passive range of motion;Dry needling;Energy conservation   PT Next Visit Plan continue with manual to reduce adhesions, ROM to improve extension and LE stability exercises.  Normalize gait. Dymamic balance activies and review STS with Lt foot foward to improve Rt. LE weight bearing   PT Home Exercise Plan Eval: quad sets, heel slides, lateral weight shifts, knee flexion stretch, knee extension stretch    Consulted and Agree with Plan of Care Patient      Patient will benefit from skilled therapeutic intervention in order to improve the following deficits and impairments:  Abnormal gait, Decreased skin integrity, Increased fascial restricitons, Pain, Decreased coordination, Decreased mobility, Decreased scar mobility, Increased muscle spasms, Decreased activity tolerance, Decreased range of motion, Decreased strength, Hypomobility, Decreased balance, Difficulty walking, Increased edema, Impaired  flexibility  Visit Diagnosis: Stiffness of right knee, not elsewhere classified  Muscle weakness (generalized)  Difficulty in walking, not elsewhere classified  Localized edema     Problem List Patient Active Problem List   Diagnosis Date Noted  . S/P right TKA 08/28/2016  . Primary osteoarthritis of left knee 01/01/2014  . Knee stiffness 01/01/2014  . Knee pain, left 01/01/2014  . Difficulty walking 01/01/2014  . S/P left TKA 12/08/2013    Starr Lake, PT, DPT 11/09/2016, 4:23 PM  [place], [place], [address] Phone: [phone]   Fax:  [phone]  Name: Teresa Charles MRN: [id number] Date of Birth: Jul 15, 1960

## 2016-11-14 ENCOUNTER — Encounter (HOSPITAL_COMMUNITY): Payer: Self-pay | Admitting: Physical Therapy

## 2016-11-14 ENCOUNTER — Ambulatory Visit (HOSPITAL_COMMUNITY): Payer: No Typology Code available for payment source | Admitting: Physical Therapy

## 2016-11-14 DIAGNOSIS — M25661 Stiffness of right knee, not elsewhere classified: Secondary | ICD-10-CM | POA: Diagnosis not present

## 2016-11-14 DIAGNOSIS — R262 Difficulty in walking, not elsewhere classified: Secondary | ICD-10-CM

## 2016-11-14 DIAGNOSIS — R600 Localized edema: Secondary | ICD-10-CM

## 2016-11-14 DIAGNOSIS — M6281 Muscle weakness (generalized): Secondary | ICD-10-CM

## 2016-11-14 NOTE — Patient Instructions (Signed)
  STANDING CALF STRETCH  - GASTROC  Start by standing in front of a wall or other sturdy object. Step forward with one foot and maintain your toes on both feet to be pointed straight forward. Keep the leg behind you with a straight knee during the stretch. Lean forward towards the wall and support yourself with your arms as you allow your front knee to bend until a gentle stretch is felt along the back of your leg that is most behind you. Move closer or further away from the wall to control the stretch of the back leg. Also you can adjust the bend of the front knee to control the stretch as well.   Hold 30 seconds x 3 reps - twice daily     Seated Hamstring Stretch While seated, rest your heel on the floor with your knee straight and gently lean forward until a stretch is felt behind your knee/thigh.    Hold 30 seconds x 3 reps - twice daily

## 2016-11-14 NOTE — Therapy (Signed)
[place], [place], [address] Phone: [phone]   Fax:  [phone]  Physical Therapy Treatment  Patient Details  Name: Teresa Charles MRN: [id number] Date of Birth: [birth year] Referring Provider: Paralee Cancel   Encounter Date: 11/14/2016      PT End of Session - 11/14/16 1516    Visit Number 23   Number of Visits 24   Date for PT Re-Evaluation 11/16/16   Authorization Type [employer]    Authorization Time Period 0/0/76 to 04/25/31; recert done 3/54-07/02/2561; recert done 8/9-37/3   PT Start Time 1430   PT Stop Time 1510   PT Time Calculation (min) 40 min   Activity Tolerance Patient limited by pain   Behavior During Therapy [facility] for tasks assessed/performed      Past Medical History:  Diagnosis Date  . Anxiety   . Arthritis   . Depression   . Dysrhythmia    palpitations  . Hypothyroidism     Past Surgical History:  Procedure Laterality Date  . CARDIAC CATHETERIZATION  2009  . TOTAL KNEE ARTHROPLASTY Left 12/08/2013   Procedure: LEFT TOTAL KNEE ARTHROPLASTY;  Surgeon: Mauri Pole, MD;  Location: [facility];  Service: Orthopedics;  Laterality: Left;  . TOTAL KNEE ARTHROPLASTY Right 08/28/2016   Procedure: RIGHT TOTAL KNEE ARTHROPLASTY;  Surgeon: Paralee Cancel, MD;  Location: [facility];  Service: Orthopedics;  Laterality: Right;    There were no vitals filed for this visit.      Subjective Assessment - 11/14/16 1432    Subjective Pt states that she is having a little more pain today but that she has not been taking pain medication since this past friday the 14th. She states her current pain level is about a 3/10 when she pushes into excessive extension. Sitting down feels pretty good. Pt does report doing a little too much on Friday out at her house and feeling more sore over the weekend.    Pertinent History history L TKA, R TKA done 7/2, hypothyroidsm    Limitations Walking   Pain Score 3    Pain  Location Knee   Pain Orientation Right                         [facility] Adult PT Treatment/Exercise - 11/14/16 0001      Knee/Hip Exercises: Stretches   Passive Hamstring Stretch 30 seconds;3 reps   Passive Hamstring Stretch Limitations in chair   Gastroc Stretch 30 seconds;3 reps   Gastroc Stretch Limitations standing with support     Knee/Hip Exercises: Seated   Sit to Sand 5 reps;without UE support;Other (comment)  Left foot positioned forward     Knee/Hip Exercises: Supine   Quad Sets 10 reps;2 sets   Bridges with Diona Foley Squeeze Strengthening;Both;10 reps   Straight Leg Raises Strengthening;10 reps;Right   Patellar Mobs Medial/lateral and superior/inferior moderate limitations   Knee Extension Limitations 5   Knee Flexion Limitations 125   Other Supine Knee/Hip Exercises Supine clamshell Green Theraband x 15 reps      Manual Therapy   Manual Therapy Joint mobilization   Manual therapy comments Separate rest of session   Joint Mobilization patella mobility all directions    Myofascial Release posterior knee soft tissue massage    Passive ROM knee flexion and light over pressure knee extension to toleration  PT Short Term Goals - 10/31/16 1504      PT SHORT TERM GOAL #1   Title Patient to demonstrate R knee ROM as being 0-115 degrees in order to improve mechanics and reduce pain    Baseline 14-119 degrees 9/4   Time 3   Period Weeks   Status Partially Met     PT SHORT TERM GOAL #2   Title Patient to be able to consistently ambulate with heel-toe pattern with LRAD, no increase in pain and unlimited distances, in order to improve general access to home and community    Baseline still limited by pain, but speed and distance improving    Time 3   Period Weeks   Status On-going     PT SHORT TERM GOAL #3   Title Patient to experience pain R knee/LE as being no more than 5/10 in order to improve QOL and exercise tolerance     Baseline 8/13-  continues to require pain medicine    Time 3   Period Weeks   Status Achieved     PT SHORT TERM GOAL #4   Title Patient to be compliant in correct performace of HEP, to be updated PRN    Baseline 8/13-  improving but still mixed compliance    Time 1   Period Weeks   Status Achieved           PT Long Term Goals - 10/31/16 1506      PT LONG TERM GOAL #1   Title Pateint to demonstrate functional strength as being 5/5 in all tested groups in order to improve mechanics and activity tolerance    Time 6   Period Weeks   Status Achieved     PT LONG TERM GOAL #2   Title Patient to experience pain as being no more than 2/10 in order to improve QOL and facilitate return to work    Baseline remains a 4/10 most of the time.    Time 6   Period Weeks   Status On-going     PT LONG TERM GOAL #3   Title Patient to be able to perform on-feet tasks for at least 4 hours with no increase in pain in order to facilitate ability to return to work    Baseline 9/4- unable    Status On-going     PT LONG TERM GOAL #4   Title Patient to be able to ambulate with no device unlimited distances and will also be able to reciprocally ascend/descent full flight of stairs with U railing and good eccentric control in order to improve home and community access    Baseline 9/4- ongoing motivated, but limited still due to pain   Status Not Met     PT LONG TERM GOAL #5   Title Patient will be able to squat and pivot while lifting 50lb to be able to return to work.    Baseline currently unable due to balance and weakness   Time 4   Period Weeks   Status Not Met               Plan - 11/14/16 1519    Clinical Impression Statement Patient presented with slight improvement with knee ROM of 5 degrees knee flexion and 3 degrees knee extension. Pt continues to have increased pain with knee extension and quad contraction, however, states that it is more tolerable than when she first started  therapy. She reports that after last session it felt pretty good,  however, over the weekend she may have done a little too much contributing to some increased irritation.    Clinical Impairments Affecting Rehab Potential (+) success with PT for TKR in the past, motivated to participate, quick referral to rehab post-surgery   PT Frequency 1x / week   PT Duration 4 weeks   PT Treatment/Interventions ADLs/Self Care Home Management;Biofeedback;Cryotherapy;Moist Heat;DME Instruction;Gait training;Stair training;Functional mobility training;Therapeutic activities;Therapeutic exercise;Balance training;Neuromuscular re-education;Patient/family education;Manual techniques;Scar mobilization;Passive range of motion;Dry needling;Energy conservation   PT Next Visit Plan continue with manual to reduce adhesions, ROM to improve extension and LE stability exercises.  Normalize gait. Start to focus on dynamic balance and walking distance.    PT Home Exercise Plan Eval: quad sets, heel slides, lateral weight shifts, knee flexion stretch, knee extension stretch; hamstring and calf stretch 3 reps each x 30s each twice daily.    Consulted and Agree with Plan of Care Patient      Patient will benefit from skilled therapeutic intervention in order to improve the following deficits and impairments:  Abnormal gait, Decreased skin integrity, Increased fascial restricitons, Pain, Decreased coordination, Decreased mobility, Decreased scar mobility, Increased muscle spasms, Decreased activity tolerance, Decreased range of motion, Decreased strength, Hypomobility, Decreased balance, Difficulty walking, Increased edema, Impaired flexibility  Visit Diagnosis: Stiffness of right knee, not elsewhere classified  Muscle weakness (generalized)  Difficulty in walking, not elsewhere classified  Localized edema     Problem List Patient Active Problem List   Diagnosis Date Noted  . S/P right TKA 08/28/2016  . Primary  osteoarthritis of left knee 01/01/2014  . Knee stiffness 01/01/2014  . Knee pain, left 01/01/2014  . Difficulty walking 01/01/2014  . S/P left TKA 12/08/2013    Starr Lake PT, DPT 3:34 PM, 11/14/16 [facility] [address] [place], [place], [address] Phone: [phone]   Fax:  [phone]  Name: Teresa Charles MRN: [id number] Date of Birth: 1960-05-01

## 2016-11-16 ENCOUNTER — Encounter (HOSPITAL_COMMUNITY): Payer: Self-pay

## 2016-11-16 ENCOUNTER — Ambulatory Visit (HOSPITAL_COMMUNITY): Payer: No Typology Code available for payment source

## 2016-11-16 DIAGNOSIS — M25661 Stiffness of right knee, not elsewhere classified: Secondary | ICD-10-CM | POA: Diagnosis not present

## 2016-11-16 DIAGNOSIS — R600 Localized edema: Secondary | ICD-10-CM

## 2016-11-16 DIAGNOSIS — R262 Difficulty in walking, not elsewhere classified: Secondary | ICD-10-CM

## 2016-11-16 DIAGNOSIS — M6281 Muscle weakness (generalized): Secondary | ICD-10-CM

## 2016-11-16 NOTE — Therapy (Signed)
[place], [place], [address] Phone: [phone]   Fax:  [phone]  Physical Therapy Treatment/Reassessment  Patient Details  Name: Teresa Charles MRN: [id number] Date of Birth: Jan 03, 1961 Referring Provider: Paralee Cancel   Encounter Date: 11/16/2016      PT End of Session - 11/16/16 1347    Visit Number 24   Number of Visits 30   Date for PT Re-Evaluation 11/16/16   Authorization Type [employer]    Authorization Time Period recert done on 2/95 to 11/1   Authorization - Visit Number 24   Authorization - Number of Visits 30   PT Start Time 1346   PT Stop Time 7473   PT Time Calculation (min) 36 min   Activity Tolerance Patient limited by pain   Behavior During Therapy [facility] for tasks assessed/performed      Past Medical History:  Diagnosis Date  . Anxiety   . Arthritis   . Depression   . Dysrhythmia    palpitations  . Hypothyroidism     Past Surgical History:  Procedure Laterality Date  . CARDIAC CATHETERIZATION  2009  . TOTAL KNEE ARTHROPLASTY Left 12/08/2013   Procedure: LEFT TOTAL KNEE ARTHROPLASTY;  Surgeon: Mauri Pole, MD;  Location: [facility];  Service: Orthopedics;  Laterality: Left;  . TOTAL KNEE ARTHROPLASTY Right 08/28/2016   Procedure: RIGHT TOTAL KNEE ARTHROPLASTY;  Surgeon: Paralee Cancel, MD;  Location: [facility];  Service: Orthopedics;  Laterality: Right;    There were no vitals filed for this visit.      Subjective Assessment - 11/16/16 1347    Subjective Pt states that she is dragging today. She said that she's been up on it a lot today so her pain is about 3/10 right now.    Pertinent History history L TKA, R TKA done 7/2, hypothyroidsm    Limitations Walking   Currently in Pain? Yes   Pain Score 3    Pain Location Knee   Pain Orientation Right   Pain Descriptors / Indicators Aching   Pain Type Surgical pain   Pain Onset More than a month ago   Pain Frequency Constant   Aggravating Factors  walking, standing   Pain Relieving Factors pain meds   Effect of Pain on Daily Activities moderate            [facility] PT Assessment - 11/16/16 0001      AROM   Right Knee Extension 8   Right Knee Flexion 120     Ambulation/Gait   Gait Comments good heel-toe gait, mild antalgia, continued decreased knee ext on R throughout heel strike                  PT Short Term Goals - 11/16/16 1355      PT SHORT TERM GOAL #1   Title Patient to demonstrate R knee ROM as being 0-115 degrees in order to improve mechanics and reduce pain    Baseline 9/20: 8-120 this date   Time 3   Period Weeks   Status Partially Met     PT SHORT TERM GOAL #2   Title Patient to be able to consistently ambulate with heel-toe pattern with LRAD, no increase in pain and unlimited distances, in order to improve general access to home and community    Baseline 9/20: able to demo heel-toe gait but limited in her distances due to pain   Time 3   Period Weeks  Status Partially Met     PT SHORT TERM GOAL #3   Title Patient to experience pain R knee/LE as being no more than 5/10 in order to improve QOL and exercise tolerance    Baseline 9/20: worst it gets is 4/10 after over exerting   Time 3   Period Weeks   Status Achieved     PT SHORT TERM GOAL #4   Title Patient to be compliant in correct performace of HEP, to be updated PRN    Baseline 9/20: reports compliance with HEP   Time 1   Period Weeks   Status Achieved           PT Long Term Goals - 11/16/16 1357      PT LONG TERM GOAL #1   Title Pateint to demonstrate functional strength as being 5/5 in all tested groups in order to improve mechanics and activity tolerance    Time 6   Period Weeks   Status Achieved     PT LONG TERM GOAL #2   Title Patient to experience pain as being no more than 2/10 in order to improve QOL and facilitate return to work    Baseline 9/20: worst it gets is 4/10 after over exerting   Time  6   Period Weeks   Status On-going     PT LONG TERM GOAL #3   Title Patient to be able to perform on-feet tasks for at least 4 hours with no increase in pain in order to facilitate ability to return to work    Baseline 9/20: increased knee pain after being on feet for 1 hour    Status On-going     PT LONG TERM GOAL #4   Title Patient to be able to ambulate with no device unlimited distances and will also be able to reciprocally ascend/descent full flight of stairs with U railing and good eccentric control in order to improve home and community access    Baseline 9/20: able to demo heel-toe gait but limited in her distances due to pain; able to ascend stairs with good control but still has difficulty and discomfort with descending   Status On-going     PT LONG TERM GOAL #5   Title Patient will be able to squat and pivot while lifting 50lb to be able to return to work.    Baseline currently unable due to balance and weakness   Time 4   Period Weeks   Status Not Met               Plan - 11/16/16 1635    Clinical Impression Statement PT reassessed pt's goals and outcome measures this date as it was her 24/24 visit. Pt has made some improvements since her last reassessment. Her gait pattern has continued to improve and her control with ascending stairs is much improved. Pt's AROM continues to be limited in extension as she was 8 deg from neutral this date but her flexion was 120deg. Pt still has increased pain with WB activities and is limited in her ambulation distances due to R knee pain. Pt is set to see Dr. Alvan Dame on 11/22/16 for her f/u visit. Pt has 6 more aproved visits available and this PT recommends extending pt's POC to 1x/week for 6 weeks, to continue to focus on improving tolerance to WB tasks, improving extension ROM, and maximizing overall function to promote return to PLOF.   Clinical Impairments Affecting Rehab Potential (+) success with PT for TKR  in the past, motivated to  participate, quick referral to rehab post-surgery   PT Frequency 1x / week   PT Duration 6 weeks   PT Treatment/Interventions ADLs/Self Care Home Management;Biofeedback;Cryotherapy;Moist Heat;DME Instruction;Gait training;Stair training;Functional mobility training;Therapeutic activities;Therapeutic exercise;Balance training;Neuromuscular re-education;Patient/family education;Manual techniques;Scar mobilization;Passive range of motion;Dry needling;Energy conservation   PT Next Visit Plan continue with manual to reduce adhesions, ROM to improve extension and LE stability exercises.  Normalize gait. Start to focus on dynamic balance and walking distance.    PT Home Exercise Plan Eval: quad sets, heel slides, lateral weight shifts, knee flexion stretch, knee extension stretch; hamstring and calf stretch 3 reps each x 30s each twice daily.    Consulted and Agree with Plan of Care Patient      Patient will benefit from skilled therapeutic intervention in order to improve the following deficits and impairments:  Abnormal gait, Decreased skin integrity, Increased fascial restricitons, Pain, Decreased coordination, Decreased mobility, Decreased scar mobility, Increased muscle spasms, Decreased activity tolerance, Decreased range of motion, Decreased strength, Hypomobility, Decreased balance, Difficulty walking, Increased edema, Impaired flexibility  Visit Diagnosis: Stiffness of right knee, not elsewhere classified - Plan: PT plan of care cert/re-cert  Muscle weakness (generalized) - Plan: PT plan of care cert/re-cert  Difficulty in walking, not elsewhere classified - Plan: PT plan of care cert/re-cert  Localized edema - Plan: PT plan of care cert/re-cert     Problem List Patient Active Problem List   Diagnosis Date Noted  . S/P right TKA 08/28/2016  . Primary osteoarthritis of left knee 01/01/2014  . Knee stiffness 01/01/2014  . Knee pain, left 01/01/2014  . Difficulty walking 01/01/2014  .  S/P left TKA 12/08/2013      Geraldine Solar PT, DPT  [employer] [address] [place], [place], [address] Phone: [phone]   Fax:  [phone]  Name: Teresa Charles MRN: [id number] Date of Birth: Oct 02, 1960

## 2016-11-23 ENCOUNTER — Telehealth (HOSPITAL_COMMUNITY): Payer: Self-pay | Admitting: Physician Assistant

## 2016-11-23 ENCOUNTER — Ambulatory Visit (HOSPITAL_COMMUNITY): Payer: No Typology Code available for payment source | Admitting: Physical Therapy

## 2016-11-23 NOTE — Telephone Encounter (Signed)
11/23/16  said she wouldn't be here today, totally forgot about the appt.

## 2016-11-30 ENCOUNTER — Ambulatory Visit (HOSPITAL_COMMUNITY): Payer: No Typology Code available for payment source

## 2016-11-30 ENCOUNTER — Telehealth (HOSPITAL_COMMUNITY): Payer: Self-pay | Admitting: Physician Assistant

## 2016-11-30 NOTE — Telephone Encounter (Signed)
11/30/16  she said she went to her dr and he told her that therapy won't help her anymore to cx all future appts.

## 2016-12-07 ENCOUNTER — Encounter (HOSPITAL_COMMUNITY): Payer: Self-pay

## 2016-12-14 ENCOUNTER — Encounter (HOSPITAL_COMMUNITY): Payer: Self-pay

## 2016-12-21 ENCOUNTER — Encounter (HOSPITAL_COMMUNITY): Payer: Self-pay

## 2016-12-28 ENCOUNTER — Encounter (HOSPITAL_COMMUNITY): Payer: Self-pay

## 2017-11-23 ENCOUNTER — Encounter (HOSPITAL_COMMUNITY): Payer: Self-pay

## 2017-11-23 NOTE — Therapy (Signed)
[place], [place], [address] Phone: [phone]   Fax:  [phone]  Patient Details  Name: Teresa Charles MRN: [id number] Date of Birth: March 16, 1960 Referring Provider:  No ref. provider found  Encounter Date: 11/23/2017   PHYSICAL THERAPY DISCHARGE SUMMARY  Visits from Start of Care: 24  Current functional level related to goals / functional outcomes: See last note   Remaining deficits: See last note   Education / Equipment: n/a  Plan: Patient agrees to discharge.  Patient goals were partially met. Patient is being discharged due to the patient's request.  ?????     Geraldine Solar PT, [facility] [address] [place], [place], [address] Phone: [phone]   Fax:  [phone]

## 2017-11-28 ENCOUNTER — Other Ambulatory Visit (HOSPITAL_COMMUNITY): Payer: Self-pay | Admitting: Family Medicine

## 2017-11-28 DIAGNOSIS — Z1231 Encounter for screening mammogram for malignant neoplasm of breast: Secondary | ICD-10-CM

## 2018-08-07 ENCOUNTER — Other Ambulatory Visit: Payer: Self-pay

## 2018-08-07 ENCOUNTER — Ambulatory Visit (HOSPITAL_COMMUNITY)
Admission: RE | Admit: 2018-08-07 | Discharge: 2018-08-07 | Disposition: A | Discharge status: Home / Self Care | Payer: No Typology Code available for payment source | Source: Ambulatory Visit | Attending: Family Medicine | Admitting: Family Medicine

## 2018-08-07 DIAGNOSIS — Z1231 Encounter for screening mammogram for malignant neoplasm of breast: Secondary | ICD-10-CM | POA: Diagnosis present

## 2019-10-31 ENCOUNTER — Other Ambulatory Visit: Payer: Self-pay

## 2019-10-31 ENCOUNTER — Ambulatory Visit (HOSPITAL_COMMUNITY)
Admission: RE | Admit: 2019-10-31 | Discharge: 2019-10-31 | Disposition: A | Discharge status: Home / Self Care | Payer: No Typology Code available for payment source | Source: Ambulatory Visit | Attending: Family Medicine | Admitting: Family Medicine

## 2019-10-31 ENCOUNTER — Other Ambulatory Visit (HOSPITAL_COMMUNITY): Payer: Self-pay | Admitting: Family Medicine

## 2019-10-31 DIAGNOSIS — R079 Chest pain, unspecified: Secondary | ICD-10-CM

## 2020-08-16 ENCOUNTER — Encounter: Payer: Self-pay | Admitting: Emergency Medicine

## 2020-08-16 ENCOUNTER — Other Ambulatory Visit: Payer: Self-pay

## 2020-08-16 ENCOUNTER — Ambulatory Visit
Admission: EM | Admit: 2020-08-16 | Discharge: 2020-08-16 | Disposition: A | Discharge status: Home / Self Care | Payer: No Typology Code available for payment source | Attending: Family Medicine | Admitting: Family Medicine

## 2020-08-16 DIAGNOSIS — J039 Acute tonsillitis, unspecified: Secondary | ICD-10-CM

## 2020-08-16 DIAGNOSIS — R059 Cough, unspecified: Secondary | ICD-10-CM

## 2020-08-16 DIAGNOSIS — H9201 Otalgia, right ear: Secondary | ICD-10-CM

## 2020-08-16 MED ORDER — PREDNISONE 10 MG PO TABS: 10.0000 mg | ORAL_TABLET | Freq: Every day | ORAL | 0 refills | Status: AC

## 2020-08-16 MED ORDER — AMOXICILLIN 875 MG PO TABS: 875.0000 mg | ORAL_TABLET | Freq: Two times a day (BID) | ORAL | 0 refills | Status: AC

## 2020-08-16 MED ORDER — BENZONATATE 100 MG PO CAPS: 200.0000 mg | ORAL_CAPSULE | Freq: Three times a day (TID) | ORAL | 0 refills | Status: DC | PRN

## 2020-08-16 NOTE — ED Provider Notes (Signed)
[facility]    CSN: [id number] Arrival date & time: 08/16/20  1438      History   Chief Complaint Chief Complaint  Patient presents with   Sore Throat    HPI Teresa Charles is a 60 y.o. female.   HPI Patient presents today with sore throat, cough and right ear pain x 3 days, however has experience nasal congestion and sinus pressure for over one week. She now hoarse. Right side of throat is severely painful and right ear pressure and pain, afebrile.  Patient had a negative COVID test 2 days ago.  Past Medical History:  Diagnosis Date   Anxiety    Arthritis    Depression    Dysrhythmia    palpitations   Hypothyroidism     Patient Active Problem List   Diagnosis Date Noted   S/P right TKA 08/28/2016   Primary osteoarthritis of left knee 01/01/2014   Knee stiffness 01/01/2014   Knee pain, left 01/01/2014   Difficulty walking 01/01/2014   S/P left TKA 12/08/2013    Past Surgical History:  Procedure Laterality Date   CARDIAC CATHETERIZATION  2009   TOTAL KNEE ARTHROPLASTY Left 12/08/2013   Procedure: LEFT TOTAL KNEE ARTHROPLASTY;  Surgeon: Shelda Pal, MD;  Location: [facility];  Service: Orthopedics;  Laterality: Left;   TOTAL KNEE ARTHROPLASTY Right 08/28/2016   Procedure: RIGHT TOTAL KNEE ARTHROPLASTY;  Surgeon: Durene Romans, MD;  Location: [facility];  Service: Orthopedics;  Laterality: Right;    OB History     Gravida  3   Para  3   Term  3   Preterm      AB      Living         SAB      IAB      Ectopic      Multiple      Live Births               Home Medications    Prior to Admission medications   Medication Sig Start Date End Date Taking? Authorizing Provider  acetaminophen (TYLENOL) 500 MG tablet Take 2 tablets (1,000 mg total) by mouth every 8 (eight) hours. 08/28/16   Lanney Gins, PA-C  ALPRAZolam Prudy Feeler) 0.5 MG tablet Take 1 mg by mouth at bedtime as needed for anxiety or sleep.    [provider]   aspirin 81 MG chewable tablet Chew 1 tablet (81 mg total) by mouth 2 (two) times daily. Take for 4 weeks. 08/28/16   Lanney Gins, PA-C  buPROPion (WELLBUTRIN XL) 300 MG 24 hr tablet Take 300 mg by mouth daily.    [provider]  cephALEXin (KEFLEX) 500 MG capsule Take 1 capsule (500 mg total) by mouth 3 (three) times daily. 08/28/16   Lanney Gins, PA-C  diphenhydrAMINE (BENADRYL) 25 MG tablet Take 50 mg by mouth at bedtime as needed for sleep.    [provider]  docusate sodium (COLACE) 100 MG capsule Take 1 capsule (100 mg total) by mouth 2 (two) times daily. 08/28/16   Lanney Gins, PA-C  ferrous sulfate (FERROUSUL) 325 (65 FE) MG tablet Take 1 tablet (325 mg total) by mouth 3 (three) times daily with meals. 08/28/16   Lanney Gins, PA-C  levothyroxine (SYNTHROID, LEVOTHROID) 125 MCG tablet Take 125 mcg by mouth daily before breakfast.    [provider]  methocarbamol (ROBAXIN) 500 MG tablet Take 1 tablet (500 mg total) by mouth every 6 (six) hours  as needed for muscle spasms. 08/28/16   Lanney Gins, PA-C  metoprolol tartrate (LOPRESSOR) 25 MG tablet Take 25 mg by mouth daily.     [provider]  oxyCODONE (OXY IR/ROXICODONE) 5 MG immediate release tablet Take 1-3 tablets (5-15 mg total) by mouth every 4 (four) hours as needed for moderate pain, severe pain or breakthrough pain. 08/28/16   Lanney Gins, PA-C  polyethylene glycol (MIRALAX / GLYCOLAX) packet Take 17 g by mouth 2 (two) times daily. 08/28/16   Lanney Gins, PA-C    Family History Family History  Problem Relation Age of Onset   Cancer Brother     Social History Social History   Tobacco Use   Smoking status: Former    Packs/day: 1.00    Years: 35.00    Pack years: 35.00    Types: Cigarettes   Smokeless tobacco: Never  Vaping Use   Vaping Use: Every day   Start date: 08/25/2014  Substance Use Topics   Alcohol use: No   Drug use: No     Allergies   Patient has no  known allergies.   Review of Systems Review of Systems Pertinent negatives listed in HPI   Physical Exam Triage Vital Signs ED Triage Vitals  Enc Vitals Group     BP 08/16/20 1500 (!) 142/91     Pulse Rate 08/16/20 1500 79     Resp 08/16/20 1500 17     Temp 08/16/20 1500 98.2 F (36.8 C)     Temp Source 08/16/20 1500 Temporal     SpO2 08/16/20 1500 96 %     Weight --      Height --      Head Circumference --      Peak Flow --      Pain Score 08/16/20 1501 8     Pain Loc --      Pain Edu? --      Excl. in GC? --    No data found.  Updated Vital Signs BP (!) 142/91 (BP Location: Right Arm)   Pulse 79   Temp 98.2 F (36.8 C) (Temporal)   Resp 17   SpO2 96%   Visual Acuity Right Eye Distance:   Left Eye Distance:   Bilateral Distance:    Right Eye Near:   Left Eye Near:    Bilateral Near:     Physical Exam  General Appearance:    Alert, cooperative, no distress  HENT:   Normocephalic, ears normal, nares mucosal edema with congestion, rhinorrhea, oropharynx right tonsillar with exudate and edema with diffuse erythema involving the oropharyngeal region.  Right cervical adenopathy.  Eyes:    PERRL, conjunctiva/corneas clear, EOM's intact       Lungs:     Clear to auscultation bilaterally, respirations unlabored  Heart:    Regular rate and rhythm  Neurologic:   Awake, alert, oriented x 3. No apparent focal neurological           defect.      UC Treatments / Results  Labs (all labs ordered are listed, but only abnormal results are displayed) Labs Reviewed - No data to display  EKG   Radiology No results found.  Procedures Procedures (including critical care time)  Medications Ordered in UC Medications - No data to display  Initial Impression / Assessment and Plan / UC Course  I have reviewed the triage vital signs and the nursing notes.  Pertinent labs & imaging results that were available during my  care of the patient were reviewed by me and  considered in my medical decision making (see chart for details).    Treating for acute tonsillitis with amoxicillin and prednisone. For management of URI related cough benzonatate Perles and prednisone. Return precautions if symptoms worsen or do not improve. Final Clinical Impressions(s) / UC Diagnoses   Final diagnoses:  Acute tonsillitis, unspecified etiology  Otalgia of right ear  Cough   Discharge Instructions   None    ED Prescriptions     Medication Sig Dispense Auth. Provider   amoxicillin (AMOXIL) 875 MG tablet Take 1 tablet (875 mg total) by mouth 2 (two) times daily for 7 days. 14 tablet Bing Neighbors, FNP   predniSONE (DELTASONE) 10 MG tablet Take 1 tablet (10 mg total) by mouth daily for 5 days. 5 tablet Bing Neighbors, FNP   benzonatate (TESSALON) 100 MG capsule Take 2 capsules (200 mg total) by mouth 3 (three) times daily as needed for cough. 20 capsule Bing Neighbors, FNP      PDMP not reviewed this encounter.   Bing Neighbors, FNP 08/16/20 [phone]

## 2020-08-16 NOTE — ED Triage Notes (Addendum)
Sore throat, RT ear pain, cough.  Husband recently sick with the same neg for strep, flu and covid.  Pt tested neg for covid on Saturday.

## 2020-08-25 ENCOUNTER — Telehealth: Payer: Self-pay | Admitting: Family Medicine

## 2020-08-25 DIAGNOSIS — J039 Acute tonsillitis, unspecified: Secondary | ICD-10-CM

## 2020-08-25 MED ORDER — FLUCONAZOLE 150 MG PO TABS
ORAL_TABLET | ORAL | 0 refills | Status: DC
Start: 2020-08-25 — End: 2024-01-17

## 2020-08-25 NOTE — Telephone Encounter (Signed)
Opened in error

## 2020-08-25 NOTE — Telephone Encounter (Signed)
Reports vaginal yeast from antibiotic. Fluconazole prescribed. Take as directed.

## 2020-11-11 ENCOUNTER — Ambulatory Visit (HOSPITAL_COMMUNITY)
Admission: RE | Admit: 2020-11-11 | Discharge: 2020-11-11 | Disposition: A | Discharge status: Home / Self Care | Payer: No Typology Code available for payment source | Source: Ambulatory Visit | Attending: Family Medicine | Admitting: Family Medicine

## 2020-11-11 ENCOUNTER — Other Ambulatory Visit: Payer: Self-pay

## 2020-11-11 ENCOUNTER — Other Ambulatory Visit (HOSPITAL_COMMUNITY): Payer: Self-pay | Admitting: Cardiothoracic Surgery

## 2020-11-11 ENCOUNTER — Other Ambulatory Visit (HOSPITAL_COMMUNITY): Payer: Self-pay | Admitting: Family Medicine

## 2020-11-11 DIAGNOSIS — R0781 Pleurodynia: Secondary | ICD-10-CM | POA: Insufficient documentation

## 2021-05-16 ENCOUNTER — Other Ambulatory Visit (HOSPITAL_COMMUNITY): Payer: Self-pay | Admitting: Family Medicine

## 2021-05-16 DIAGNOSIS — Z1231 Encounter for screening mammogram for malignant neoplasm of breast: Secondary | ICD-10-CM

## 2021-05-23 ENCOUNTER — Other Ambulatory Visit: Payer: Self-pay

## 2021-05-23 ENCOUNTER — Ambulatory Visit (HOSPITAL_COMMUNITY)
Admission: RE | Admit: 2021-05-23 | Discharge: 2021-05-23 | Disposition: A | Discharge status: Home / Self Care | Payer: No Typology Code available for payment source | Source: Ambulatory Visit | Attending: Family Medicine | Admitting: Family Medicine

## 2021-05-23 DIAGNOSIS — Z1231 Encounter for screening mammogram for malignant neoplasm of breast: Secondary | ICD-10-CM | POA: Insufficient documentation

## 2023-07-21 ENCOUNTER — Encounter: Payer: Self-pay | Admitting: *Deleted

## 2023-07-21 ENCOUNTER — Ambulatory Visit
Admission: EM | Admit: 2023-07-21 | Discharge: 2023-07-21 | Disposition: A | Discharge status: Home / Self Care | Attending: Nurse Practitioner | Admitting: Nurse Practitioner

## 2023-07-21 DIAGNOSIS — R399 Unspecified symptoms and signs involving the genitourinary system: Secondary | ICD-10-CM | POA: Diagnosis not present

## 2023-07-21 LAB — POCT URINALYSIS DIP (MANUAL ENTRY)
Glucose, UA: NEGATIVE mg/dL
Ketones, POC UA: NEGATIVE mg/dL
Leukocytes, UA: NEGATIVE
Nitrite, UA: NEGATIVE
Protein Ur, POC: 100 mg/dL — AB
Spec Grav, UA: >=1.03 — AB (ref 1.010–1.025)
Urobilinogen, UA: 0.2 U/dL
pH, UA: 5.5 (ref 5.0–8.0)

## 2023-07-21 MED ORDER — PROMETHAZINE HCL 12.5 MG PO TABS: 12.5000 mg | ORAL_TABLET | Freq: Three times a day (TID) | ORAL | 0 refills | Status: DC | PRN

## 2023-07-21 MED ORDER — CEPHALEXIN 500 MG PO CAPS: 500.0000 mg | ORAL_CAPSULE | Freq: Four times a day (QID) | ORAL | 0 refills | Status: DC

## 2023-07-21 NOTE — ED Provider Notes (Signed)
 [facility]    CSN: [id number] Arrival date & time: 07/21/23  1423      History   Chief Complaint Chief Complaint  Patient presents with   Headache   bladder pressure    HPI Teresa Charles is a 63 y.o. female.   The history is provided by the patient.   Patient presents for ongoing nausea, suprapubic pressure, and headache that is been present for the past several days.  Patient was seen by her PCP last week and diagnosed with a UTI.  Patient states she was prescribed Augmentin but cannot complete the medication due to vomiting.  She states that when she did see her PCP, she was given an injection of medication, but does not recall the name.  States that she did follow-up with the PCP office and advised that she cannot take the antibiotic.  She states that the office told her that she should be okay since she had the injection.  Patient states that she has had ongoing nausea and vomiting.  States that she does not think the Zofran  that she was prescribed by her PCP is working.  Patient denies fever, chills, hematuria, decreased urine stream, low back pain, abdominal pain, diarrhea, or constipation.  Past Medical History:  Diagnosis Date   Anxiety    Arthritis    Depression    Dysrhythmia    palpitations   Hypothyroidism     Patient Active Problem List   Diagnosis Date Noted   S/P right TKA 08/28/2016   Primary osteoarthritis of left knee 01/01/2014   Knee stiffness 01/01/2014   Knee pain, left 01/01/2014   Difficulty walking 01/01/2014   S/P left TKA 12/08/2013    Past Surgical History:  Procedure Laterality Date   CARDIAC CATHETERIZATION  2009   TOTAL KNEE ARTHROPLASTY Left 12/08/2013   Procedure: LEFT TOTAL KNEE ARTHROPLASTY;  Surgeon: Bevin Bucks, MD;  Location: [facility];  Service: Orthopedics;  Laterality: Left;   TOTAL KNEE ARTHROPLASTY Right 08/28/2016   Procedure: RIGHT TOTAL KNEE ARTHROPLASTY;  Surgeon: Claiborne Crew, MD;  Location: [facility];   Service: Orthopedics;  Laterality: Right;    OB History     Gravida  3   Para  3   Term  3   Preterm      AB      Living         SAB      IAB      Ectopic      Multiple      Live Births               Home Medications    Prior to Admission medications   Medication Sig Start Date End Date Taking? Authorizing Provider  ALPRAZolam (XANAX) 0.5 MG tablet Take 1 mg by mouth at bedtime as needed for anxiety or sleep.   Yes [provider]  atorvastatin (LIPITOR) 10 MG tablet Take 10 mg by mouth daily. 06/14/23  Yes [provider]  buPROPion (WELLBUTRIN XL) 300 MG 24 hr tablet Take 300 mg by mouth daily.   Yes [provider]  cephALEXin  (KEFLEX ) 500 MG capsule Take 1 capsule (500 mg total) by mouth 4 (four) times daily. 07/21/23  Yes Leath-Warren, Belen Bowers, NP  escitalopram (LEXAPRO) 20 MG tablet Take 20 mg by mouth daily. 05/24/23  Yes [provider]  levothyroxine (SYNTHROID, LEVOTHROID) 125 MCG tablet Take 125 mcg by mouth daily before breakfast.   Yes [provider]  metoprolol tartrate (LOPRESSOR) 25 MG tablet Take 25 mg by mouth daily.    Yes [provider]  promethazine  (PHENERGAN ) 12.5 MG tablet Take 1 tablet (12.5 mg total) by mouth every 8 (eight) hours as needed for nausea or vomiting. 07/21/23  Yes Leath-Warren, Belen Bowers, NP  acetaminophen  (TYLENOL ) 500 MG tablet Take 2 tablets (1,000 mg total) by mouth every 8 (eight) hours. 08/28/16   Equilla Hastings, PA-C  aspirin  81 MG chewable tablet Chew 1 tablet (81 mg total) by mouth 2 (two) times daily. Take for 4 weeks. 08/28/16   Equilla Hastings, PA-C  benzonatate  (TESSALON ) 100 MG capsule Take 2 capsules (200 mg total) by mouth 3 (three) times daily as needed for cough. 08/16/20   Buena Carmine, NP  diphenhydrAMINE (BENADRYL) 25 MG tablet Take 50 mg by mouth at bedtime as needed for sleep.    [provider]  docusate sodium  (COLACE) 100 MG capsule  Take 1 capsule (100 mg total) by mouth 2 (two) times daily. 08/28/16   Equilla Hastings, PA-C  ferrous sulfate  (FERROUSUL) 325 (65 FE) MG tablet Take 1 tablet (325 mg total) by mouth 3 (three) times daily with meals. 08/28/16   Equilla Hastings, PA-C  fluconazole  (DIFLUCAN ) 150 MG tablet Take one tablet at the onset of symptoms. If symptoms are still present 3 days later, take the second tablet. 08/25/20   Wellington Half, FNP  methocarbamol  (ROBAXIN ) 500 MG tablet Take 1 tablet (500 mg total) by mouth every 6 (six) hours as needed for muscle spasms. 08/28/16   Equilla Hastings, PA-C  oxyCODONE  (OXY IR/ROXICODONE ) 5 MG immediate release tablet Take 1-3 tablets (5-15 mg total) by mouth every 4 (four) hours as needed for moderate pain, severe pain or breakthrough pain. 08/28/16   Equilla Hastings, PA-C  polyethylene glycol (MIRALAX  / GLYCOLAX ) packet Take 17 g by mouth 2 (two) times daily. 08/28/16   Equilla Hastings, PA-C    Family History Family History  Problem Relation Age of Onset   Cancer Brother     Social History Social History   Tobacco Use   Smoking status: Former    Current packs/day: 1.00    Average packs/day: 1 pack/day for 35.0 years (35.0 ttl pk-yrs)    Types: Cigarettes   Smokeless tobacco: Never  Vaping Use   Vaping status: Every Day   Start date: 08/25/2014  Substance Use Topics   Alcohol use: No   Drug use: No     Allergies   Patient has no known allergies.   Review of Systems Review of Systems Per HPI  Physical Exam Triage Vital Signs ED Triage Vitals  Encounter Vitals Group     BP 07/21/23 1445 127/82     Systolic BP Percentile --      Diastolic BP Percentile --      Pulse Rate 07/21/23 1445 77     Resp 07/21/23 1445 18     Temp 07/21/23 1445 (!) 97.5 F (36.4 C)     Temp Source 07/21/23 1445 Oral     SpO2 07/21/23 1445 97 %     Weight --      Height --      Head Circumference --      Peak Flow --      Pain Score 07/21/23 1443 6     Pain Loc --       Pain Education --      Exclude from Growth Chart --    No data found.  Updated Vital Signs BP 127/82 (BP Location: Right Arm)   Pulse 77   Temp (!) 97.5 F (36.4 C) (Oral)   Resp 18   SpO2 97%   Visual Acuity Right Eye Distance:   Left Eye Distance:   Bilateral Distance:    Right Eye Near:   Left Eye Near:    Bilateral Near:     Physical Exam Vitals and nursing note reviewed.  Constitutional:      General: She is not in acute distress.    Appearance: She is well-developed.  HENT:     Head: Normocephalic.  Eyes:     Extraocular Movements: Extraocular movements intact.     Conjunctiva/sclera: Conjunctivae normal.     Pupils: Pupils are equal, round, and reactive to light.  Cardiovascular:     Rate and Rhythm: Normal rate and regular rhythm.     Pulses: Normal pulses.     Heart sounds: Normal heart sounds.  Pulmonary:     Effort: Pulmonary effort is normal. No respiratory distress.     Breath sounds: Normal breath sounds. No stridor. No wheezing, rhonchi or rales.  Chest:     Chest wall: No tenderness.  Abdominal:     General: Bowel sounds are normal.     Palpations: Abdomen is soft.     Tenderness: There is abdominal tenderness in the suprapubic area. There is no right CVA tenderness or left CVA tenderness.  Musculoskeletal:     Cervical back: Normal range of motion.  Lymphadenopathy:     Cervical: No cervical adenopathy.  Skin:    General: Skin is warm and dry.  Neurological:     General: No focal deficit present.     Mental Status: She is alert and oriented to person, place, and time.  Psychiatric:        Mood and Affect: Mood normal.        Behavior: Behavior normal.      UC Treatments / Results  Labs (all labs ordered are listed, but only abnormal results are displayed) Labs Reviewed  POCT URINALYSIS DIP (MANUAL ENTRY) - Abnormal; Notable for the following components:      Result Value   Clarity, UA cloudy (*)    Bilirubin, UA small (*)    Spec  Grav, UA >=1.030 (*)    Blood, UA moderate (*)    Protein Ur, POC =100 (*)    All other components within normal limits  URINE CULTURE    EKG   Radiology No results found.  Procedures Procedures (including critical care time)  Medications Ordered in UC Medications - No data to display  Initial Impression / Assessment and Plan / UC Course  I have reviewed the triage vital signs and the nursing notes.  Pertinent labs & imaging results that were available during my care of the patient were reviewed by me and considered in my medical decision making (see chart for details).  Urinalysis does not indicate an obvious urinary tract infection although patient does have protein and moderate blood.  Will start patient on Keflex  500 mg 4 times daily for UTI.  Phenergan  12.5 mg tablets prescribed for ongoing nausea.  Supportive care recommendations were provided and discussed with the patient to include fluids, over-the-counter Tylenol , avoiding caffeine, and to develop a toileting schedule.  Patient was advised that if symptoms fail to improve with this treatment, it is recommended that she follow-up with her PCP.  Patient was given strict ER follow-up precautions.  Patient was in  agreement with this plan of care and verbalizes understanding.  All questions were answered.  Patient stable for discharge.  Final Clinical Impressions(s) / UC Diagnoses   Final diagnoses:  UTI symptoms     Discharge Instructions      A urine culture has been ordered.  You will be contacted if the pending test result is abnormal.  You will also access to the results via MyChart.  If the result of the culture is negative, you will be contacted and advised to stop the antibiotic prescribed today. Take medication as prescribed. You may take over-the-counter Tylenol  as needed for pain, fever, or general discomfort. Try to increase your fluid intake.  Recommend Pedialyte or Gatorade if there is concern for  dehydration. Develop a toileting schedule that will allow you to urinate at least every 2 hours. Avoid caffeine such as tea, soda, or coffee while symptoms persist. Go to the emergency department immediately if you experience worsening urinary symptoms, or develop new symptoms of fever, pain around your kidneys, or other concerns. Please follow-up with your primary care physician next week for reevaluation. Follow-up as needed.   ED Prescriptions     Medication Sig Dispense Auth. Provider   cephALEXin  (KEFLEX ) 500 MG capsule Take 1 capsule (500 mg total) by mouth 4 (four) times daily. 28 capsule Leath-Warren, Belen Bowers, NP   promethazine  (PHENERGAN ) 12.5 MG tablet Take 1 tablet (12.5 mg total) by mouth every 8 (eight) hours as needed for nausea or vomiting. 15 tablet Leath-Warren, Belen Bowers, NP      PDMP not reviewed this encounter.   Hardy Lia, NP 07/21/23 [id number]

## 2023-07-21 NOTE — Discharge Instructions (Addendum)
 A urine culture has been ordered.  You will be contacted if the pending test result is abnormal.  You will also access to the results via MyChart.  If the result of the culture is negative, you will be contacted and advised to stop the antibiotic prescribed today. Take medication as prescribed. You may take over-the-counter Tylenol  as needed for pain, fever, or general discomfort. Try to increase your fluid intake.  Recommend Pedialyte or Gatorade if there is concern for dehydration. Develop a toileting schedule that will allow you to urinate at least every 2 hours. Avoid caffeine such as tea, soda, or coffee while symptoms persist. Go to the emergency department immediately if you experience worsening urinary symptoms, or develop new symptoms of fever, pain around your kidneys, or other concerns. Please follow-up with your primary care physician next week for reevaluation. Follow-up as needed.

## 2023-07-21 NOTE — ED Triage Notes (Signed)
 Pt states that she seen her pcp at [facility] on Thursday and was dx with UTI, she was given an antibiotic but couldn't take it due to vomiting, only took three doses. She states she has a headache and bladder pressure now. She took two IBU this morning which helped the headache.   She states she was given Zofran  and Augmentin at her last ov on Thursday both she has stopped.

## 2023-07-22 LAB — URINE CULTURE: Culture: NO GROWTH

## 2023-07-23 ENCOUNTER — Ambulatory Visit (HOSPITAL_COMMUNITY): Payer: Self-pay

## 2023-11-20 ENCOUNTER — Other Ambulatory Visit (HOSPITAL_COMMUNITY): Payer: Self-pay | Admitting: Family Medicine

## 2023-11-20 DIAGNOSIS — M810 Age-related osteoporosis without current pathological fracture: Secondary | ICD-10-CM

## 2023-11-20 DIAGNOSIS — Z13820 Encounter for screening for osteoporosis: Secondary | ICD-10-CM

## 2023-11-21 ENCOUNTER — Other Ambulatory Visit (HOSPITAL_COMMUNITY): Payer: Self-pay | Admitting: Family Medicine

## 2023-11-21 DIAGNOSIS — Z1231 Encounter for screening mammogram for malignant neoplasm of breast: Secondary | ICD-10-CM

## 2023-11-23 ENCOUNTER — Other Ambulatory Visit: Payer: Self-pay | Admitting: *Deleted

## 2023-11-23 DIAGNOSIS — Z1211 Encounter for screening for malignant neoplasm of colon: Secondary | ICD-10-CM

## 2023-11-27 ENCOUNTER — Ambulatory Visit: Admitting: Surgery

## 2023-11-28 ENCOUNTER — Ambulatory Visit (HOSPITAL_COMMUNITY)
Admission: RE | Admit: 2023-11-28 | Discharge: 2023-11-28 | Disposition: A | Discharge status: Home / Self Care | Source: Ambulatory Visit | Attending: Family Medicine | Admitting: Family Medicine

## 2023-11-28 DIAGNOSIS — Z13820 Encounter for screening for osteoporosis: Secondary | ICD-10-CM | POA: Diagnosis present

## 2023-11-28 DIAGNOSIS — M810 Age-related osteoporosis without current pathological fracture: Secondary | ICD-10-CM | POA: Insufficient documentation

## 2023-11-28 DIAGNOSIS — Z1231 Encounter for screening mammogram for malignant neoplasm of breast: Secondary | ICD-10-CM | POA: Insufficient documentation

## 2023-12-03 ENCOUNTER — Encounter: Payer: Self-pay | Admitting: Family Medicine

## 2023-12-05 ENCOUNTER — Other Ambulatory Visit (HOSPITAL_COMMUNITY): Payer: Self-pay | Admitting: Neurosurgery

## 2023-12-05 DIAGNOSIS — M542 Cervicalgia: Secondary | ICD-10-CM

## 2023-12-12 ENCOUNTER — Other Ambulatory Visit (HOSPITAL_COMMUNITY): Payer: Self-pay | Admitting: Family Medicine

## 2023-12-12 DIAGNOSIS — I700 Atherosclerosis of aorta: Secondary | ICD-10-CM

## 2023-12-13 ENCOUNTER — Ambulatory Visit (HOSPITAL_COMMUNITY)
Admission: RE | Admit: 2023-12-13 | Discharge: 2023-12-13 | Disposition: A | Discharge status: Home / Self Care | Source: Ambulatory Visit | Attending: Neurosurgery | Admitting: Neurosurgery

## 2023-12-13 DIAGNOSIS — M542 Cervicalgia: Secondary | ICD-10-CM | POA: Insufficient documentation

## 2023-12-20 ENCOUNTER — Ambulatory Visit (HOSPITAL_COMMUNITY)
Admission: RE | Admit: 2023-12-20 | Discharge: 2023-12-20 | Disposition: A | Discharge status: Home / Self Care | Source: Ambulatory Visit | Attending: Family Medicine | Admitting: Family Medicine

## 2023-12-20 DIAGNOSIS — I700 Atherosclerosis of aorta: Secondary | ICD-10-CM | POA: Insufficient documentation

## 2023-12-20 MED ORDER — IOHEXOL 350 MG/ML SOLN
75.0000 mL | Freq: Once | INTRAVENOUS | Status: AC | PRN
  Administered 2023-12-20: 75 mL via INTRAVENOUS

## 2024-01-17 ENCOUNTER — Encounter: Payer: Self-pay | Admitting: Internal Medicine

## 2024-01-17 ENCOUNTER — Ambulatory Visit: Attending: Internal Medicine | Admitting: Internal Medicine

## 2024-01-17 VITALS — BP 114/78 | HR 81 | Ht 65.0 in | Wt 160.6 lb

## 2024-01-17 DIAGNOSIS — Z136 Encounter for screening for cardiovascular disorders: Secondary | ICD-10-CM | POA: Diagnosis present

## 2024-01-17 DIAGNOSIS — E7849 Other hyperlipidemia: Secondary | ICD-10-CM | POA: Diagnosis present

## 2024-01-17 DIAGNOSIS — E785 Hyperlipidemia, unspecified: Secondary | ICD-10-CM | POA: Insufficient documentation

## 2024-01-17 DIAGNOSIS — R002 Palpitations: Secondary | ICD-10-CM | POA: Diagnosis not present

## 2024-01-17 DIAGNOSIS — Q248 Other specified congenital malformations of heart: Secondary | ICD-10-CM | POA: Insufficient documentation

## 2024-01-17 NOTE — Progress Notes (Signed)
 Cardiology Office Note  Date: 01/17/2024   ID: Teresa Charles, DOB 08/23/1960, MRN [id number]  PCP:  Teresa Jenkins Jansky, FNP  Cardiologist:  None Electrophysiologist:  None   History of Present Illness: Teresa Charles is a 63 y.o. female  Referred to cardiology clinic for evaluation of pericardial cyst.  Prior records/imaging reviewed.  I reviewed and discussed the imaging findings with the patient today.  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  Patient reports having chest pains here and there, not frequent.  Not angina.  Does not have any other symptoms of DOE, dizziness, syncope, leg swelling.  Past Medical History:  Diagnosis Date   Anxiety    Arthritis    Depression    Dysrhythmia    palpitations   Hypothyroidism     Past Surgical History:  Procedure Laterality Date   CARDIAC CATHETERIZATION  2009   TOTAL KNEE ARTHROPLASTY Left 12/08/2013   Procedure: LEFT TOTAL KNEE ARTHROPLASTY;  Surgeon: Donnice JONETTA Car, MD;  Location: [facility];  Service: Orthopedics;  Laterality: Left;   TOTAL KNEE ARTHROPLASTY Right 08/28/2016   Procedure: RIGHT TOTAL KNEE ARTHROPLASTY;  Surgeon: Car Donnice, MD;  Location: [facility];  Service: Orthopedics;  Laterality: Right;    Current Outpatient Medications  Medication Sig Dispense Refill   acetaminophen  (TYLENOL ) 500 MG tablet Take 2 tablets (1,000 mg total) by mouth every 8 (eight) hours. 30 tablet 0   ALPRAZolam (XANAX) 0.5 MG tablet Take 1 mg by mouth at bedtime as needed for anxiety or sleep.     amoxicillin  (AMOXIL ) 500 MG capsule 4 capsules Orally 1 hour before dental procedures     atorvastatin (LIPITOR) 10 MG tablet Take 10 mg by mouth daily.     benzonatate  (TESSALON ) 100 MG capsule Take 2 capsules (200 mg total) by mouth 3 (three) times daily as needed for cough. 20 capsule 0   buPROPion (WELLBUTRIN XL) 300 MG 24 hr tablet Take 300 mg by mouth daily.     escitalopram (LEXAPRO) 20 MG tablet Take  20 mg by mouth daily.     levothyroxine (SYNTHROID) 112 MCG tablet Take 112 mcg by mouth daily before breakfast.     methocarbamol  (ROBAXIN ) 500 MG tablet Take 1 tablet (500 mg total) by mouth every 6 (six) hours as needed for muscle spasms. 40 tablet 0   metoprolol tartrate (LOPRESSOR) 25 MG tablet Take 25 mg by mouth daily.      valACYclovir (VALTREX) 500 MG tablet Take 500 mg by mouth daily as needed.     aspirin  81 MG chewable tablet Chew 1 tablet (81 mg total) by mouth 2 (two) times daily. Take for 4 weeks. (Patient not taking: Reported on 01/17/2024) 60 tablet 0   No current facility-administered medications for this visit.   Allergies:  Patient has no known allergies.   Social History: The patient  reports that she has quit smoking. Her smoking use included cigarettes. She has a 35 pack-year smoking history. She has never used smokeless tobacco. She reports that she does not drink alcohol and does not use drugs.   Family History: The patient's family history includes Cancer in her brother.   ROS:  Please see the history of present illness. Otherwise, complete review of systems is positive for none  All other systems are reviewed and negative.   Physical Exam: VS:  BP 114/78   Pulse 81   Ht 5' 5 (1.651 m)   Wt 160 lb 9.6  oz (72.8 kg)   SpO2 98%   BMI 26.73 kg/m , BMI Body mass index is 26.73 kg/m.  Wt Readings from Last 3 Encounters:  01/17/24 160 lb 9.6 oz (72.8 kg)  08/28/16 185 lb (83.9 kg)  08/24/16 185 lb (83.9 kg)    General: Patient appears comfortable at rest. HEENT: Conjunctiva and lids normal, oropharynx clear with moist mucosa. Neck: Supple, no elevated JVP or carotid bruits, no thyromegaly. Lungs: Clear to auscultation, nonlabored breathing at rest. Cardiac: Regular rate and rhythm, no S3 or significant systolic murmur, no pericardial rub. Abdomen: Soft, nontender, no hepatomegaly, bowel sounds present, no guarding or rebound. Extremities: No pitting edema,  distal pulses 2+. Skin: Warm and dry. Musculoskeletal: No kyphosis. Neuropsychiatric: Alert and oriented x3, affect grossly appropriate.  Recent Labwork: No results found for requested labs within last 365 days.     Component Value Date/Time   CHOL  05/17/2007 1030    181        ATP III CLASSIFICATION:  <200     mg/dL   Desirable  799-760  mg/dL   Borderline High  >=759    mg/dL   High   TRIG 833 (H) 96/79/7990 1030   HDL 29 (L) 05/17/2007 1030   CHOLHDL 6.2 05/17/2007 1030   VLDL 33 05/17/2007 1030   LDLCALC (H) 05/17/2007 1030    119        Total Cholesterol/HDL:CHD Risk Coronary Heart Disease Risk Table                     Men   Women  1/2 Average Risk   3.4   3.3    Assessment and Plan:   Pericardial cyst: Patient underwent CT angio chest that showed possible pericardial cyst.  CT angio chest images personally reviewed.  She has chest pains sometimes but not bothersome.  Not angina.  No other symptoms.  Obtain cardiac MRI for definitive evaluation.  Palpitations: Occasional.  Continue metoprolol tartrate 25 mg, taking once daily.  Has a lot of anxiety.  HLD, unknown values: Continue atorvastatin 10 mg nightly.  Follows with PCP.  30 minutes spent in reviewing prior medical records, more than 3 labs, discussion and documentation.  Medication Adjustments/Labs and Tests Ordered: Current medicines are reviewed at length with the patient today.  Concerns regarding medicines are outlined above.    Disposition:  Follow up pending results  Signed Ameila Weldon Priya Zubayr Bednarczyk, MD, 01/17/2024 1:59 PM    [facility] at [facility] [address] [place], [place], [place] [address]

## 2024-01-17 NOTE — Patient Instructions (Addendum)
 Medication Instructions:  Your physician recommends that you continue on your current medications as directed. Please refer to the Current Medication list given to you today.  *If you need a refill on your cardiac medications before your next appointment, please call your pharmacy*  Lab Work: H&H  If you have labs (blood work) drawn today and your tests are completely normal, you will receive your results only by: MyChart Message (if you have MyChart) OR A paper copy in the mail If you have any lab test that is abnormal or we need to change your treatment, we will call you to review the results.  Testing/Procedures: Cardiac MRI  Follow-Up: At [facility], you and your health needs are our priority.  As part of our continuing mission to provide you with exceptional heart care, our providers are all part of one team.  This team includes your primary Cardiologist (physician) and Advanced Practice Providers or APPs (Physician Assistants and Nurse Practitioners) who all work together to provide you with the care you need, when you need it.  Your next appointment:    To Be Determined  Provider:   You may see Vishnu Mallipeddi, MD or the following Advanced Practice Provider on your designated Care Team:   Almarie Crate, NP    We recommend signing up for the patient portal called MyChart.  Sign up information is provided on this After Visit Summary.  MyChart is used to connect with patients for Virtual Visits (Telemedicine).  Patients are able to view lab/test results, encounter notes, upcoming appointments, etc.  Non-urgent messages can be sent to your provider as well.   To learn more about what you can do with MyChart, go to [email or web address].   Other Instructions   You are scheduled for Cardiac MRI at the location below.  Please arrive for your appointment at ______________ . ?  [facility] [address] [place], [place] [address] Please take advantage  of the free valet parking available at the [facility] and [employer] (Entrance C).  Proceed to the [facility] Radiology Department (First Floor) for check-in.    Magnetic resonance imaging (MRI) is a painless test that produces images of the inside of the body without using Xrays.  During an MRI, strong magnets and radio waves work together in a data processing manager to form detailed images.   MRI images may provide more details about a medical condition than X-rays, CT scans, and ultrasounds can provide.  You may be given earphones to listen for instructions.  You may eat a light breakfast and take medications as ordered with the exception of furosemide, hydrochlorothiazide, chlorthalidone or spironolactone (or any other fluid pill). If you are undergoing a stress MRI, please avoid stimulants for 12 hr prior to test. (I.e. Caffeine, nicotine, chocolate, or antihistamine medications)  If your provider has ordered anti-anxiety medications for this test, then you will need a driver.  An IV will be inserted into one of your veins. Contrast material will be injected into your IV. It will leave your body through your urine within a day. You may be told to drink plenty of fluids to help flush the contrast material out of your system.  You will be asked to remove all metal, including: Watch, jewelry, and other metal objects including hearing aids, hair pieces and dentures. Also wearable glucose monitoring systems (ie. Freestyle Libre and Omnipods) (Braces and fillings normally are not a problem.)   TEST WILL TAKE APPROXIMATELY 1 HOUR  PLEASE NOTIFY SCHEDULING  AT LEAST 24 HOURS IN ADVANCE IF YOU ARE UNABLE TO KEEP YOUR APPOINTMENT. [phone]  For more information and frequently asked questions, please visit our website : [email or web address]  Please call the Cardiac Imaging Nurse Navigators with any questions/concerns. [phone] Office

## 2024-01-22 ENCOUNTER — Encounter (HOSPITAL_COMMUNITY): Payer: Self-pay

## 2024-01-23 ENCOUNTER — Ambulatory Visit: Payer: Self-pay | Admitting: Student

## 2024-01-23 LAB — HEMOGLOBIN AND HEMATOCRIT, BLOOD
Hematocrit: 45.6 % (ref 34.0–46.6)
Hemoglobin: 14.6 g/dL (ref 11.1–15.9)

## 2024-01-25 ENCOUNTER — Ambulatory Visit (HOSPITAL_COMMUNITY)
Admission: RE | Admit: 2024-01-25 | Discharge: 2024-01-25 | Disposition: A | Discharge status: Home / Self Care | Source: Ambulatory Visit | Attending: Internal Medicine | Admitting: Internal Medicine

## 2024-01-25 ENCOUNTER — Other Ambulatory Visit: Payer: Self-pay | Admitting: Internal Medicine

## 2024-01-25 DIAGNOSIS — Q248 Other specified congenital malformations of heart: Secondary | ICD-10-CM

## 2024-01-25 MED ORDER — GADOBUTROL 1 MMOL/ML IV SOLN
10.0000 mL | Freq: Once | INTRAVENOUS | Status: AC | PRN
  Administered 2024-01-25: 10 mL via INTRAVENOUS

## 2024-01-29 NOTE — Telephone Encounter (Signed)
 The patient has been notified of the result and verbalized understanding.  All questions (if any) were answered. Littie CHRISTELLA Croak, CMA 01/29/2024 9:24 AM

## 2024-01-29 NOTE — Telephone Encounter (Signed)
-----   Message from Laymon CHRISTELLA Qua sent at 01/23/2024 11:49 AM EST ----- Covering for Dr. Mallipeddi - Please let the patient know her hemoglobin and hematocrit remain stable. Continue with plans for cardiac MRI this week.  ----- Message ----- From: Interface, [facility] Lab Results In Sent: 01/23/2024   5:38 AM EST To: Vishnu P Mallipeddi, MD

## 2024-02-19 ENCOUNTER — Ambulatory Visit (HOSPITAL_COMMUNITY): Attending: Neurosurgery

## 2024-02-19 ENCOUNTER — Other Ambulatory Visit: Payer: Self-pay

## 2024-02-19 DIAGNOSIS — M436 Torticollis: Secondary | ICD-10-CM | POA: Insufficient documentation

## 2024-02-19 DIAGNOSIS — M542 Cervicalgia: Secondary | ICD-10-CM | POA: Diagnosis present

## 2024-02-19 NOTE — Therapy (Signed)
 " OUTPATIENT PHYSICAL THERAPY CERVICAL EVALUATION   Patient Name: Teresa Charles MRN: [id number] DOB:14-Apr-1960, 63 y.o., female Today's Date: 02/19/2024  END OF SESSION:  PT End of Session - 02/19/24 0950     Visit Number 1    Number of Visits 8    Date for Recertification  03/18/24    Authorization Type medicare    Authorization Time Period no auth needed    PT Start Time 0946    PT Stop Time 1025    PT Time Calculation (min) 39 min    Activity Tolerance Patient tolerated treatment well    Behavior During Therapy [facility] for tasks assessed/performed          Past Medical History:  Diagnosis Date   Anxiety    Arthritis    Depression    Dysrhythmia    palpitations   Hypothyroidism    Past Surgical History:  Procedure Laterality Date   CARDIAC CATHETERIZATION  2009   TOTAL KNEE ARTHROPLASTY Left 12/08/2013   Procedure: LEFT TOTAL KNEE ARTHROPLASTY;  Surgeon: Donnice JONETTA Car, MD;  Location: [facility];  Service: Orthopedics;  Laterality: Left;   TOTAL KNEE ARTHROPLASTY Right 08/28/2016   Procedure: RIGHT TOTAL KNEE ARTHROPLASTY;  Surgeon: Car Donnice, MD;  Location: [facility];  Service: Orthopedics;  Laterality: Right;   Patient Active Problem List   Diagnosis Date Noted   Pericardial cyst 01/17/2024   Palpitations 01/17/2024   HLD (hyperlipidemia) 01/17/2024   S/P right TKA 08/28/2016   Primary osteoarthritis of left knee 01/01/2014   Knee stiffness 01/01/2014   Knee pain, left 01/01/2014   Difficulty walking 01/01/2014   S/P left TKA 12/08/2013    PCP: Teresa Jenkins Jansky FNP  REFERRING PROVIDER: Onetha Kuba, MD  REFERRING DIAG: M54.2 (ICD-10-CM) - Cervicalgia  THERAPY DIAG:  Neck pain - Plan: PT plan of care cert/re-cert  Neck stiffness - Plan: PT plan of care cert/re-cert  Rationale for Evaluation and Treatment: Habilitation  ONSET DATE: 1 year or so  SUBJECTIVE:                                                                                                                                                                                                          SUBJECTIVE STATEMENT: Neck pain and UE pain for a while; pain goes up to the back of her neck toward head and ears; also has some pain in arms; PCP sent to Dr. Onetha who referred to physical therapy Hand dominance: Right  PERTINENT HISTORY:  Bilateral TKA's Heart palpitations Nov of this  year Anxiety Osteoporosis per patient  PAIN:  Are you having pain? Yes: NPRS scale: 4/10 Pain location: neck Pain description: dull ache Aggravating factors: turning head Relieving factors: lay down, heat  PRECAUTIONS: Fall  RED FLAGS: None     WEIGHT BEARING RESTRICTIONS: No  FALLS:  Has patient fallen in last 6 months? Yes. Number of falls tripped and fell a few times; working in the garden  OCCUPATION: retired from [employer]  PLOF: Independent  PATIENT GOALS: make pain go away  NEXT MD VISIT: PRN  OBJECTIVE:  Note: Objective measures were completed at Evaluation unless otherwise noted.  DIAGNOSTIC FINDINGS:  IMPRESSION: 1. No acute findings or clear explanation for the patient's symptoms. 2. Multilevel cervical spondylosis, most advanced at C5-6 and C6-7 where there is mild spinal stenosis and moderate osseous foraminal narrowing bilaterally. 3. Asymmetric left-sided facet hypertrophy at C3-4 with associated subchondral edema which may contribute to left neck pain. Associated moderate left foraminal narrowing and possible left C4 nerve root encroachment. 4. Asymmetric left-sided facet hypertrophy at C4-5 with mild to moderate left foraminal narrowing. 5. No abnormal cord signal.     Electronically Signed   By: Elsie Perone M.D.   On: 12/15/2023 14:44    PATIENT SURVEYS:  NDI:  NECK DISABILITY INDEX  Date: 02/19/2024 Score                                Total 18/50; 36%   Minimum Detectable Change (90% confidence): 5 points or 10%  points  COGNITION: Overall cognitive status: Within functional limits for tasks assessed  SENSATION: Sometimes N/T in hands; more L than right  POSTURE: rounded shoulders and forward head  PALPATION:    CERVICAL ROM:   Active ROM AROM (deg) eval  Flexion full  Extension 55  Right lateral flexion 21  Left lateral flexion 25  Right rotation 66  Left rotation 58   (Blank rows = not tested)  UPPER EXTREMITY ROM:  Active ROM Right eval Left eval  Shoulder flexion full full  Shoulder extension    Shoulder abduction    Shoulder adduction    Shoulder extension    Shoulder internal rotation    Shoulder external rotation    Elbow flexion    Elbow extension    Wrist flexion    Wrist extension    Wrist ulnar deviation    Wrist radial deviation    Wrist pronation    Wrist supination     (Blank rows = not tested)  UPPER EXTREMITY MMT:  MMT Right eval Left eval  Shoulder flexion 4 4  Shoulder extension    Shoulder abduction    Shoulder adduction    Shoulder extension    Shoulder internal rotation    Shoulder external rotation    Middle trapezius    Lower trapezius    Elbow flexion 4 4+  Elbow extension 4 4+  Wrist flexion    Wrist extension    Wrist ulnar deviation    Wrist radial deviation    Wrist pronation    Wrist supination     thumb extension 4 4+   (Blank rows = not tested)    TREATMENT DATE: 02/19/2024 physical therapy evaluation and HEP instruction  PATIENT EDUCATION:  Education details: Patient educated on exam findings, POC, scope of PT, HEP, and what to expect next visit. Person educated: Patient Education method: Explanation, Demonstration, and Handouts Education comprehension: verbalized understanding, returned demonstration, verbal cues required, and tactile cues required   HOME EXERCISE PROGRAM: Access  Code: [email or web address] URL: [email or web address] Date: 02/19/2024 Prepared by: [facility] - Rehab  Exercises - Seated Gentle Upper Trapezius Stretch  - 2 x daily - 7 x weekly - 1 sets - 3 reps - Gentle Levator Scapulae Stretch  - 2 x daily - 7 x weekly - 1 sets - 3 reps  ASSESSMENT:  CLINICAL IMPRESSION: Patient is a 63 y.o. female who was seen today for physical therapy evaluation and treatment for M54.2 (ICD-10-CM) - Cervicalgia. Patient demonstrates decreased strength, ROM restriction, reduced flexibility, increased tenderness to palpation and postural abnormalities which are likely contributing to symptoms of pain and are negatively impacting patient ability to perform ADLs. Patient will benefit from skilled physical therapy services to address these deficits to reduce pain and improve level of function with ADLs   OBJECTIVE IMPAIRMENTS: decreased activity tolerance, decreased ROM, decreased strength, postural dysfunction, and pain.   ACTIVITY LIMITATIONS: carrying, lifting, sleeping, and reach over head  PARTICIPATION LIMITATIONS: meal prep, cleaning, laundry, driving, and community activity  REHAB POTENTIAL: Good  CLINICAL DECISION MAKING: Evolving/moderate complexity  EVALUATION COMPLEXITY: Moderate   GOALS: Goals reviewed with patient? No  SHORT TERM GOALS: Target date: 03/04/2024  patient will be independent with initial HEP and compliant with HEP 3-4 times a week   Baseline:  Goal status: INITIAL  2.  Patient will report 50% improvement overall  Baseline:  Goal status: INITIAL   LONG TERM GOALS: Target date: 03/18/2024  Patient will be independent in self management strategies to improve quality of life and functional outcomes.  Baseline:  Goal status: INITIAL  2.  Patient will report 70% improvement overall  Baseline:  Goal status: INITIAL  3.  Patient will improve NDI score by 6 points to demonstrate improved perceived function  Baseline:  18/50 Goal status: INITIAL  4.  Patient will increase cervical mobility by 20 degrees throughout to improve ability to scan for safety with driving  Baseline: see above Goal status: INITIAL  PLAN:  PT FREQUENCY: 2x/week  PT DURATION: 4 weeks  PLANNED INTERVENTIONS: 97164- PT Re-evaluation, 97110-Therapeutic exercises, 97530- Therapeutic activity, 97112- Neuromuscular re-education, 97535- Self Care, 02859- Manual therapy, [id number]- Gait training, [phone]- Orthotic Fit/training, [phone]- Canalith repositioning, [id number]- Aquatic Therapy, 97760- Splinting, [phone]- Wound care (first 20 sq cm), 97598- Wound care (each additional 20 sq cm)Patient/Family education, Balance training, Stair training, Taping, Dry Needling, Joint mobilization, Joint manipulation, Spinal manipulation, Spinal mobilization, Scar mobilization, and DME instructions.   PLAN FOR NEXT SESSION: Review HEP and goals; cervical mobility; postural strengthening; STM; manual as needed; trial of manual traction   10:29 AM, 02/19/2024 Marshun Duva Small Kayanna Mckillop MPT [facility] [place] [phone] Ph:[phone]       "

## 2024-02-22 ENCOUNTER — Ambulatory Visit: Admitting: Cardiology

## 2024-02-25 ENCOUNTER — Ambulatory Visit
Admission: RE | Admit: 2024-02-25 | Discharge: 2024-02-25 | Disposition: A | Discharge status: Home / Self Care | Payer: Self-pay | Source: Ambulatory Visit | Attending: Nurse Practitioner | Admitting: Nurse Practitioner

## 2024-02-25 VITALS — BP 115/81 | HR 75 | Temp 98.6°F | Resp 20

## 2024-02-25 DIAGNOSIS — R399 Unspecified symptoms and signs involving the genitourinary system: Secondary | ICD-10-CM | POA: Diagnosis not present

## 2024-02-25 LAB — POCT URINE DIPSTICK
Glucose, UA: NEGATIVE mg/dL
Ketones, POC UA: NEGATIVE mg/dL
Nitrite, UA: NEGATIVE
POC PROTEIN,UA: 100 — AB
Spec Grav, UA: 1.025
Urobilinogen, UA: 0.2 U/dL
pH, UA: 6

## 2024-02-25 MED ORDER — CEPHALEXIN 500 MG PO CAPS: 500.0000 mg | ORAL_CAPSULE | Freq: Four times a day (QID) | ORAL | 0 refills | Status: DC

## 2024-02-25 NOTE — ED Triage Notes (Signed)
 Pt reports urinary frequency, urgency, discomfort with urination, abdominal pressure, lower back pain, chills x 1 mo.

## 2024-02-25 NOTE — Discharge Instructions (Signed)
 A urine culture has been ordered.  You will be contacted if the pending test result is abnormal.  If the urine culture results are negative and you are continue to experience symptoms, follow-up with your primary care physician as discussed.   Take medication as prescribed. You may take over-the-counter Tylenol  as needed for pain, fever, or general discomfort. Try to drink at least 8-10 8 ounce glasses of water daily while symptoms persist. Develop a toileting schedule that will allow you to urinate at least every 2 hours. Avoid caffeine such as tea, soda, or coffee while symptoms persist. Go to the emergency department immediately if you experience worsening urinary symptoms, or develop new symptoms of fever, pain around your kidneys, or other concerns. Follow-up as needed.

## 2024-02-25 NOTE — ED Provider Notes (Signed)
 " [facility]    CSN: [id number] Arrival date & time: 02/25/24  1250      History   Chief Complaint Chief Complaint  Patient presents with   Urinary Frequency    feels like a UTI or bladder infection. Frequent urination, clear urine, painful. - Entered by patient    HPI Teresa Charles is a 63 y.o. female.   The history is provided by the patient.   Patient presents with a 1 month history of chills, urinary frequency, urgency, pain with urination, suprapubic pressure, and low back pain.  She denies fever, hematuria, flank pain, decreased urine stream, abdominal pain, or vaginal symptoms.  Patient reports that has been quite sometime since she had a UTI.  She was last seen here in May with the same or similar symptoms, but her urine culture was negative.  Denies history of kidney infection or kidney stones.  Past Medical History:  Diagnosis Date   Anxiety    Arthritis    Depression    Dysrhythmia    palpitations   Hypothyroidism     Patient Active Problem List   Diagnosis Date Noted   Pericardial cyst 01/17/2024   Palpitations 01/17/2024   HLD (hyperlipidemia) 01/17/2024   S/P right TKA 08/28/2016   Primary osteoarthritis of left knee 01/01/2014   Knee stiffness 01/01/2014   Knee pain, left 01/01/2014   Difficulty walking 01/01/2014   S/P left TKA 12/08/2013    Past Surgical History:  Procedure Laterality Date   CARDIAC CATHETERIZATION  2009   TOTAL KNEE ARTHROPLASTY Left 12/08/2013   Procedure: LEFT TOTAL KNEE ARTHROPLASTY;  Surgeon: Donnice JONETTA Car, MD;  Location: [facility];  Service: Orthopedics;  Laterality: Left;   TOTAL KNEE ARTHROPLASTY Right 08/28/2016   Procedure: RIGHT TOTAL KNEE ARTHROPLASTY;  Surgeon: Car Donnice, MD;  Location: [facility];  Service: Orthopedics;  Laterality: Right;    OB History     Gravida  3   Para  3   Term  3   Preterm      AB      Living         SAB      IAB      Ectopic      Multiple      Live Births                Home Medications    Prior to Admission medications  Medication Sig Start Date End Date Taking? Authorizing Provider  cephALEXin  (KEFLEX ) 500 MG capsule Take 1 capsule (500 mg total) by mouth 4 (four) times daily. 02/25/24  Yes Leath-Warren, Etta PARAS, NP  acetaminophen  (TYLENOL ) 500 MG tablet Take 2 tablets (1,000 mg total) by mouth every 8 (eight) hours. 08/28/16   Danella Donnice, PA-C  ALPRAZolam (XANAX) 0.5 MG tablet Take 1 mg by mouth at bedtime as needed for anxiety or sleep.    [provider]  amoxicillin  (AMOXIL ) 500 MG capsule 4 capsules Orally 1 hour before dental procedures    [provider]  aspirin  81 MG chewable tablet Chew 1 tablet (81 mg total) by mouth 2 (two) times daily. Take for 4 weeks. Patient not taking: Reported on 01/17/2024 08/28/16   Danella Donnice, PA-C  atorvastatin (LIPITOR) 10 MG tablet Take 10 mg by mouth daily. 06/14/23   [provider]  benzonatate  (TESSALON ) 100 MG capsule Take 2 capsules (200 mg total) by mouth 3 (three) times daily as needed for cough. 08/16/20   Arloa,  Suzen RAMAN, NP  buPROPion (WELLBUTRIN XL) 300 MG 24 hr tablet Take 300 mg by mouth daily.    [provider]  escitalopram (LEXAPRO) 20 MG tablet Take 20 mg by mouth daily. 05/24/23   [provider]  levothyroxine (SYNTHROID) 112 MCG tablet Take 112 mcg by mouth daily before breakfast.    [provider]  methocarbamol  (ROBAXIN ) 500 MG tablet Take 1 tablet (500 mg total) by mouth every 6 (six) hours as needed for muscle spasms. 08/28/16   Danella Cough, PA-C  metoprolol tartrate (LOPRESSOR) 25 MG tablet Take 25 mg by mouth daily.     [provider]  valACYclovir (VALTREX) 500 MG tablet Take 500 mg by mouth daily as needed.    [provider]    Family History Family History  Problem Relation Age of Onset   Cancer Brother     Social History Social History[1]   Allergies   Patient has no  known allergies.   Review of Systems Review of Systems Per HPI  Physical Exam Triage Vital Signs ED Triage Vitals  Encounter Vitals Group     BP 02/25/24 1328 115/81     Girls Systolic BP Percentile --      Girls Diastolic BP Percentile --      Boys Systolic BP Percentile --      Boys Diastolic BP Percentile --      Pulse Rate 02/25/24 1328 75     Resp 02/25/24 1328 20     Temp 02/25/24 1328 98.6 F (37 C)     Temp Source 02/25/24 1328 Oral     SpO2 02/25/24 1328 96 %     Weight --      Height --      Head Circumference --      Peak Flow --      Pain Score 02/25/24 1332 0     Pain Loc --      Pain Education --      Exclude from Growth Chart --    No data found.  Updated Vital Signs BP 115/81 (BP Location: Right Arm)   Pulse 75   Temp 98.6 F (37 C) (Oral)   Resp 20   SpO2 96%   Visual Acuity Right Eye Distance:   Left Eye Distance:   Bilateral Distance:    Right Eye Near:   Left Eye Near:    Bilateral Near:     Physical Exam Vitals and nursing note reviewed.  Constitutional:      General: She is not in acute distress.    Appearance: Normal appearance.  HENT:     Head: Normocephalic.  Eyes:     Extraocular Movements: Extraocular movements intact.     Pupils: Pupils are equal, round, and reactive to light.  Cardiovascular:     Rate and Rhythm: Normal rate and regular rhythm.     Pulses: Normal pulses.     Heart sounds: Normal heart sounds.  Pulmonary:     Effort: Pulmonary effort is normal. No respiratory distress.     Breath sounds: Normal breath sounds. No stridor. No wheezing, rhonchi or rales.  Abdominal:     General: Bowel sounds are normal.     Palpations: Abdomen is soft.     Tenderness: There is abdominal tenderness in the suprapubic area. There is no right CVA tenderness or left CVA tenderness.  Musculoskeletal:     Cervical back: Normal range of motion.  Skin:    General: Skin  is warm and dry.  Neurological:     General: No focal  deficit present.     Mental Status: She is alert and oriented to person, place, and time.  Psychiatric:        Mood and Affect: Mood normal.        Behavior: Behavior normal.      UC Treatments / Results  Labs (all labs ordered are listed, but only abnormal results are displayed) Labs Reviewed  POCT URINE DIPSTICK - Abnormal; Notable for the following components:      Result Value   Clarity, UA cloudy (*)    Bilirubin, UA small (*)    Blood, UA moderate (*)    POC PROTEIN,UA =100 (*)    Leukocytes, UA Small (1+) (*)    All other components within normal limits  URINE CULTURE    EKG   Radiology No results found.  Procedures Procedures (including critical care time)  Medications Ordered in UC Medications - No data to display  Initial Impression / Assessment and Plan / UC Course  I have reviewed the triage vital signs and the nursing notes.  Pertinent labs & imaging results that were available during my care of the patient were reviewed by me and considered in my medical decision making (see chart for details).  Urinalysis is positive for leukocytes, protein, and blood, suggestive of a urinary tract infection.  On exam, the patient is well-appearing, she is in no acute distress, vital signs are stable.  She does not exhibit any bilateral CVA tenderness.  Will treat for possible UTI while the urine culture is pending.  Treat with Keflex  500 mg 4 times daily.  Supportive care recommendations were provided and discussed with the patient to include over-the-counter analgesics, fluids, rest, avoiding caffeine, and develop any toileting schedule.  Patient was again advised to follow-up with her primary care physician if the urine culture is negative and she continues to experience symptoms.  Patient was in agreement with this plan of care and verbalizes understanding.  All questions were answered.  Patient stable for discharge.   Final Clinical Impressions(s) / UC Diagnoses    Final diagnoses:  UTI symptoms     Discharge Instructions      A urine culture has been ordered.  You will be contacted if the pending test result is abnormal.  If the urine culture results are negative and you are continue to experience symptoms, follow-up with your primary care physician as discussed.   Take medication as prescribed. You may take over-the-counter Tylenol  as needed for pain, fever, or general discomfort. Try to drink at least 8-10 8 ounce glasses of water daily while symptoms persist. Develop a toileting schedule that will allow you to urinate at least every 2 hours. Avoid caffeine such as tea, soda, or coffee while symptoms persist. Go to the emergency department immediately if you experience worsening urinary symptoms, or develop new symptoms of fever, pain around your kidneys, or other concerns. Follow-up as needed.       ED Prescriptions     Medication Sig Dispense Auth. Provider   cephALEXin  (KEFLEX ) 500 MG capsule Take 1 capsule (500 mg total) by mouth 4 (four) times daily. 28 capsule Leath-Warren, Etta PARAS, NP      PDMP not reviewed this encounter.    [1]  Social History Tobacco Use   Smoking status: Former    Current packs/day: 1.00    Average packs/day: 1 pack/day for 35.0 years (35.0 ttl pk-yrs)  Types: Cigarettes   Smokeless tobacco: Never  Vaping Use   Vaping status: Every Day   Start date: 08/25/2014  Substance Use Topics   Alcohol use: No   Drug use: No     Gilmer Etta PARAS, NP 02/25/24 1355  "

## 2024-02-27 ENCOUNTER — Ambulatory Visit (HOSPITAL_COMMUNITY): Payer: Self-pay

## 2024-02-27 LAB — URINE CULTURE: Culture: 50000 — AB

## 2024-03-03 ENCOUNTER — Ambulatory Visit (HOSPITAL_COMMUNITY)

## 2024-03-06 ENCOUNTER — Ambulatory Visit (HOSPITAL_COMMUNITY): Attending: Neurosurgery

## 2024-03-06 ENCOUNTER — Ambulatory Visit: Admitting: Internal Medicine

## 2024-03-06 DIAGNOSIS — M436 Torticollis: Secondary | ICD-10-CM | POA: Diagnosis present

## 2024-03-06 DIAGNOSIS — M542 Cervicalgia: Secondary | ICD-10-CM | POA: Insufficient documentation

## 2024-03-06 NOTE — Therapy (Signed)
 " OUTPATIENT PHYSICAL THERAPY CERVICAL TREATMENT   Patient Name: Teresa Charles MRN: [id number] DOB:02-19-1961, 64 y.o., female Today's Date: 03/06/2024  END OF SESSION:  PT End of Session - 03/06/24 1420     Visit Number 2    Number of Visits 8    Date for Recertification  03/18/24    Authorization Type medicare    Authorization Time Period no auth needed    PT Start Time 1418    PT Stop Time 1458    PT Time Calculation (min) 40 min    Activity Tolerance Patient tolerated treatment well    Behavior During Therapy [facility] for tasks assessed/performed          Past Medical History:  Diagnosis Date   Anxiety    Arthritis    Depression    Dysrhythmia    palpitations   Hypothyroidism    Past Surgical History:  Procedure Laterality Date   CARDIAC CATHETERIZATION  2009   TOTAL KNEE ARTHROPLASTY Left 12/08/2013   Procedure: LEFT TOTAL KNEE ARTHROPLASTY;  Surgeon: Donnice JONETTA Car, MD;  Location: [facility];  Service: Orthopedics;  Laterality: Left;   TOTAL KNEE ARTHROPLASTY Right 08/28/2016   Procedure: RIGHT TOTAL KNEE ARTHROPLASTY;  Surgeon: Car Donnice, MD;  Location: [facility];  Service: Orthopedics;  Laterality: Right;   Patient Active Problem List   Diagnosis Date Noted   Pericardial cyst 01/17/2024   Palpitations 01/17/2024   HLD (hyperlipidemia) 01/17/2024   S/P right TKA 08/28/2016   Primary osteoarthritis of left knee 01/01/2014   Knee stiffness 01/01/2014   Knee pain, left 01/01/2014   Difficulty walking 01/01/2014   S/P left TKA 12/08/2013    PCP: Teresa Jenkins Jansky FNP  REFERRING PROVIDER: Onetha Kuba, MD  REFERRING DIAG: M54.2 (ICD-10-CM) - Cervicalgia  THERAPY DIAG:  Neck pain  Neck stiffness  Rationale for Evaluation and Treatment: Habilitation  ONSET DATE: 1 year or so  SUBJECTIVE:                                                                                                                                                                                                          SUBJECTIVE STATEMENT: States her stomach has been upset off and on for a while; does have a UTI and is on antibiotic Keflex ; neck felt better after last treatment for a couple days.    Eval:Neck pain and UE pain for a while; pain goes up to the back of her neck toward head and ears; also has some pain in arms; PCP sent to Dr.  Onetha who referred to physical therapy Hand dominance: Right  PERTINENT HISTORY:  Bilateral TKA's Heart palpitations Nov of this year Anxiety Osteoporosis per patient  PAIN:  Are you having pain? Yes: NPRS scale: 4/10 Pain location: neck Pain description: dull ache Aggravating factors: turning head Relieving factors: lay down, heat  PRECAUTIONS: Fall  RED FLAGS: None     WEIGHT BEARING RESTRICTIONS: No  FALLS:  Has patient fallen in last 6 months? Yes. Number of falls tripped and fell a few times; working in the garden  OCCUPATION: retired from [employer]  PLOF: Independent  PATIENT GOALS: make pain go away  NEXT MD VISIT: PRN  OBJECTIVE:  Note: Objective measures were completed at Evaluation unless otherwise noted.  DIAGNOSTIC FINDINGS:  IMPRESSION: 1. No acute findings or clear explanation for the patient's symptoms. 2. Multilevel cervical spondylosis, most advanced at C5-6 and C6-7 where there is mild spinal stenosis and moderate osseous foraminal narrowing bilaterally. 3. Asymmetric left-sided facet hypertrophy at C3-4 with associated subchondral edema which may contribute to left neck pain. Associated moderate left foraminal narrowing and possible left C4 nerve root encroachment. 4. Asymmetric left-sided facet hypertrophy at C4-5 with mild to moderate left foraminal narrowing. 5. No abnormal cord signal.     Electronically Signed   By: Elsie Perone M.D.   On: 12/15/2023 14:44    PATIENT SURVEYS:  NDI:  NECK DISABILITY INDEX  Date: 02/19/2024 Score                                 Total 18/50; 36%   Minimum Detectable Change (90% confidence): 5 points or 10% points  COGNITION: Overall cognitive status: Within functional limits for tasks assessed  SENSATION: Sometimes N/T in hands; more L than right  POSTURE: rounded shoulders and forward head  PALPATION:    CERVICAL ROM:   Active ROM AROM (deg) eval  Flexion full  Extension 55  Right lateral flexion 21  Left lateral flexion 25  Right rotation 66  Left rotation 58   (Blank rows = not tested)  UPPER EXTREMITY ROM:  Active ROM Right eval Left eval  Shoulder flexion full full  Shoulder extension    Shoulder abduction    Shoulder adduction    Shoulder extension    Shoulder internal rotation    Shoulder external rotation    Elbow flexion    Elbow extension    Wrist flexion    Wrist extension    Wrist ulnar deviation    Wrist radial deviation    Wrist pronation    Wrist supination     (Blank rows = not tested)  UPPER EXTREMITY MMT:  MMT Right eval Left eval  Shoulder flexion 4 4  Shoulder extension    Shoulder abduction    Shoulder adduction    Shoulder extension    Shoulder internal rotation    Shoulder external rotation    Middle trapezius    Lower trapezius    Elbow flexion 4 4+  Elbow extension 4 4+  Wrist flexion    Wrist extension    Wrist ulnar deviation    Wrist radial deviation    Wrist pronation    Wrist supination     thumb extension 4 4+   (Blank rows = not tested)    TREATMENT DATE:  03/06/24 Review of HEP and goals Sitting  Moist heat to cervical spine x 5' to decrease pain and  improve soft tissue extensibility Upper trap stretch 5 x 20 Levator stretch 5 x 20 Cervical rotation with towel X 5 Each side STM to cervical spine x 10'    02/19/2024 physical therapy evaluation and HEP instruction                                                                                                                                 PATIENT EDUCATION:   Education details: Patient educated on exam findings, POC, scope of PT, HEP, and what to expect next visit. Person educated: Patient Education method: Explanation, Demonstration, and Handouts Education comprehension: verbalized understanding, returned demonstration, verbal cues required, and tactile cues required   HOME EXERCISE PROGRAM: Access Code: [email or web address]  Seated Assisted Cervical Rotation with Towel  - 2 x daily - 7 x weekly - 1 sets - 5 reps Access Code: [email or web address] URL: [email or web address] Date: 02/19/2024 Prepared by: [facility] - Rehab  Exercises - Seated Gentle Upper Trapezius Stretch  - 2 x daily - 7 x weekly - 1 sets - 3 reps - Gentle Levator Scapulae Stretch  - 2 x daily - 7 x weekly - 1 sets - 3 reps  ASSESSMENT:  CLINICAL IMPRESSION: Today's session started with a review of HEP and goals; patient verbalizes agreement with set rehab goals.  Patient continues with tightness bilateral upper traps and limited cervical motion and neck pain.  Added cervical rotation stretch with towel today and updated HEP with that exercise.  STM to bilateral upper traps and cervical paraspinals with noted tightness left upper trap > right with palpable trigger points.  Did discuss dry  needling with patient as a helpful intervention.  Patient will benefit from continued skilled therapy services to address deficits and promote return to optimal function.      Eval:Patient is a 63 y.o. female who was seen today for physical therapy evaluation and treatment for M54.2 (ICD-10-CM) - Cervicalgia. Patient demonstrates decreased strength, ROM restriction, reduced flexibility, increased tenderness to palpation and postural abnormalities which are likely contributing to symptoms of pain and are negatively impacting patient ability to perform ADLs. Patient will benefit from skilled physical therapy services to address these deficits to reduce pain and improve level of function with ADLs   OBJECTIVE  IMPAIRMENTS: decreased activity tolerance, decreased ROM, decreased strength, postural dysfunction, and pain.   ACTIVITY LIMITATIONS: carrying, lifting, sleeping, and reach over head  PARTICIPATION LIMITATIONS: meal prep, cleaning, laundry, driving, and community activity  REHAB POTENTIAL: Good  CLINICAL DECISION MAKING: Evolving/moderate complexity  EVALUATION COMPLEXITY: Moderate   GOALS: Goals reviewed with patient? No  SHORT TERM GOALS: Target date: 03/04/2024  patient will be independent with initial HEP and compliant with HEP 3-4 times a week   Baseline:  Goal status: in progress  2.  Patient will report 50% improvement overall  Baseline:  Goal status: in progress   LONG TERM GOALS: Target date: 03/18/2024  Patient will be independent in self  management strategies to improve quality of life and functional outcomes.  Baseline:  Goal status: in progress  2.  Patient will report 70% improvement overall  Baseline:  Goal status: in progress  3.  Patient will improve NDI score by 6 points to demonstrate improved perceived function  Baseline: 18/50 Goal status: in progress  4.  Patient will increase cervical mobility by 20 degrees throughout to improve ability to scan for safety with driving  Baseline: see above Goal status: in progress  PLAN:  PT FREQUENCY: 2x/week  PT DURATION: 4 weeks  PLANNED INTERVENTIONS: 97164- PT Re-evaluation, 97110-Therapeutic exercises, 97530- Therapeutic activity, 97112- Neuromuscular re-education, 97535- Self Care, 02859- Manual therapy, [id number]- Gait training, [phone]- Orthotic Fit/training, [phone]- Canalith repositioning, [id number]- Aquatic Therapy, 97760- Splinting, [phone]- Wound care (first 20 sq cm), 97598- Wound care (each additional 20 sq cm)Patient/Family education, Balance training, Stair training, Taping, Dry Needling, Joint mobilization, Joint manipulation, Spinal manipulation, Spinal mobilization, Scar mobilization, and DME  instructions.   PLAN FOR NEXT SESSION:  cervical mobility; postural strengthening; STM; manual as needed; trial of manual traction   2:58 PM, 03/06/2024 Jeroline Wolbert [employer] MPT [place] [facility] [place] [phone] Ph:[phone]       "

## 2024-03-10 ENCOUNTER — Ambulatory Visit (HOSPITAL_COMMUNITY)

## 2024-03-10 ENCOUNTER — Encounter (HOSPITAL_COMMUNITY): Payer: Self-pay

## 2024-03-10 DIAGNOSIS — M436 Torticollis: Secondary | ICD-10-CM

## 2024-03-10 DIAGNOSIS — M542 Cervicalgia: Secondary | ICD-10-CM | POA: Diagnosis not present

## 2024-03-10 NOTE — Therapy (Signed)
 " OUTPATIENT PHYSICAL THERAPY CERVICAL TREATMENT   Patient Name: Teresa Charles MRN: [id number] DOB:07-12-1960, 64 y.o., female Today's Date: 03/10/2024  END OF SESSION:  PT End of Session - 03/10/24 1332     Visit Number 3    Number of Visits 8    Date for Recertification  03/18/24    Authorization Type medicare    Authorization Time Period no auth needed    Progress Note Due on Visit 10    PT Start Time 1332    PT Stop Time 1410    PT Time Calculation (min) 38 min    Activity Tolerance Patient tolerated treatment well    Behavior During Therapy WFL for tasks assessed/performed           Past Medical History:  Diagnosis Date   Anxiety    Arthritis    Depression    Dysrhythmia    palpitations   Hypothyroidism    Past Surgical History:  Procedure Laterality Date   CARDIAC CATHETERIZATION  2009   TOTAL KNEE ARTHROPLASTY Left 12/08/2013   Procedure: LEFT TOTAL KNEE ARTHROPLASTY;  Surgeon: Donnice JONETTA Car, MD;  Location: [facility];  Service: Orthopedics;  Laterality: Left;   TOTAL KNEE ARTHROPLASTY Right 08/28/2016   Procedure: RIGHT TOTAL KNEE ARTHROPLASTY;  Surgeon: Car Donnice, MD;  Location: [facility];  Service: Orthopedics;  Laterality: Right;   Patient Active Problem List   Diagnosis Date Noted   Pericardial cyst 01/17/2024   Palpitations 01/17/2024   HLD (hyperlipidemia) 01/17/2024   S/P right TKA 08/28/2016   Primary osteoarthritis of left knee 01/01/2014   Knee stiffness 01/01/2014   Knee pain, left 01/01/2014   Difficulty walking 01/01/2014   S/P left TKA 12/08/2013    PCP: Teresa Jenkins Jansky FNP  REFERRING PROVIDER: Onetha Kuba, MD  REFERRING DIAG: M54.2 (ICD-10-CM) - Cervicalgia  THERAPY DIAG:  Neck pain  Neck stiffness  Rationale for Evaluation and Treatment: Habilitation  ONSET DATE: 1 year or so  SUBJECTIVE:                                                                                                                                                                                                          SUBJECTIVE STATEMENT: Pt states she was feeling pretty good after last session. Pt states neck is still causing her pain about 4/10. Pt states she does the HEP when she is bored.   Eval:Neck pain and UE pain for a while; pain goes up to the back of her neck toward head and ears; also has  some pain in arms; PCP sent to Dr. Onetha who referred to physical therapy Hand dominance: Right  PERTINENT HISTORY:  Bilateral TKA's Heart palpitations Nov of this year Anxiety Osteoporosis per patient  PAIN:  Are you having pain? Yes: NPRS scale: 4/10 Pain location: neck Pain description: dull ache Aggravating factors: turning head Relieving factors: lay down, heat  PRECAUTIONS: Fall  RED FLAGS: None     WEIGHT BEARING RESTRICTIONS: No  FALLS:  Has patient fallen in last 6 months? Yes. Number of falls tripped and fell a few times; working in the garden  OCCUPATION: retired from [employer]  PLOF: Independent  PATIENT GOALS: make pain go away  NEXT MD VISIT: PRN  OBJECTIVE:  Note: Objective measures were completed at Evaluation unless otherwise noted.  DIAGNOSTIC FINDINGS:  IMPRESSION: 1. No acute findings or clear explanation for the patient's symptoms. 2. Multilevel cervical spondylosis, most advanced at C5-6 and C6-7 where there is mild spinal stenosis and moderate osseous foraminal narrowing bilaterally. 3. Asymmetric left-sided facet hypertrophy at C3-4 with associated subchondral edema which may contribute to left neck pain. Associated moderate left foraminal narrowing and possible left C4 nerve root encroachment. 4. Asymmetric left-sided facet hypertrophy at C4-5 with mild to moderate left foraminal narrowing. 5. No abnormal cord signal.     Electronically Signed   By: Elsie Perone M.D.   On: 12/15/2023 14:44    PATIENT SURVEYS:  NDI:  NECK DISABILITY INDEX  Date: 02/19/2024 Score                                 Total 18/50; 36%   Minimum Detectable Change (90% confidence): 5 points or 10% points  COGNITION: Overall cognitive status: Within functional limits for tasks assessed  SENSATION: Sometimes N/T in hands; more L than right  POSTURE: rounded shoulders and forward head  PALPATION:    CERVICAL ROM:   Active ROM AROM (deg) eval  Flexion full  Extension 55  Right lateral flexion 21  Left lateral flexion 25  Right rotation 66  Left rotation 58   (Blank rows = not tested)  UPPER EXTREMITY ROM:  Active ROM Right eval Left eval  Shoulder flexion full full  Shoulder extension    Shoulder abduction    Shoulder adduction    Shoulder extension    Shoulder internal rotation    Shoulder external rotation    Elbow flexion    Elbow extension    Wrist flexion    Wrist extension    Wrist ulnar deviation    Wrist radial deviation    Wrist pronation    Wrist supination     (Blank rows = not tested)  UPPER EXTREMITY MMT:  MMT Right eval Left eval  Shoulder flexion 4 4  Shoulder extension    Shoulder abduction    Shoulder adduction    Shoulder extension    Shoulder internal rotation    Shoulder external rotation    Middle trapezius    Lower trapezius    Elbow flexion 4 4+  Elbow extension 4 4+  Wrist flexion    Wrist extension    Wrist ulnar deviation    Wrist radial deviation    Wrist pronation    Wrist supination     thumb extension 4 4+   (Blank rows = not tested)    TREATMENT DATE:  03/10/2024  Education on modalities during MHP to cervical spine, 5  minutes Manual Therapy: -Cervical distractions (forearm and mastoid variations), 5 reps 10 second holds each -Segmental rotation and lateral flexion mobilizations, grade II and III, pt cued for relaxation throughout -Cervical CPA and UPA mobilizations, grade II and III, C2-T7 Therapeutic Exercise: -UBE, 4 minutes, level 3 resistance, pt cued for 60-70 spm -Seated thoracic  extensions with towel placed behind back, 1 set of 6 reps, 5 second holds, pt cued for sequencing and UE support for OP -Shoulder extension/rows, 2 set of 10 reps, blue theraband at chest level, pt cued for eccentric control and scapular retraction   03/06/24 Review of HEP and goals Sitting  Moist heat to cervical spine x 5' to decrease pain and improve soft tissue extensibility Upper trap stretch 5 x 20 Levator stretch 5 x 20 Cervical rotation with towel X 5 Each side STM to cervical spine x 10'    02/19/2024 physical therapy evaluation and HEP instruction                                                                                                                                 PATIENT EDUCATION:  Education details: Patient educated on exam findings, POC, scope of PT, HEP, and what to expect next visit. Person educated: Patient Education method: Explanation, Demonstration, and Handouts Education comprehension: verbalized understanding, returned demonstration, verbal cues required, and tactile cues required   HOME EXERCISE PROGRAM: Access Code: [email or web address]  Seated Assisted Cervical Rotation with Towel  - 2 x daily - 7 x weekly - 1 sets - 5 reps Access Code: [email or web address] URL: [email or web address] Date: 02/19/2024 Prepared by: [facility] - Rehab  Exercises - Seated Gentle Upper Trapezius Stretch  - 2 x daily - 7 x weekly - 1 sets - 3 reps - Gentle Levator Scapulae Stretch  - 2 x daily - 7 x weekly - 1 sets - 3 reps  ASSESSMENT:  CLINICAL IMPRESSION: Patient continues to demonstrate decreased cervical spine ROM, decreased scapular strength, and abnormal posturing. Patient also demonstrates difficulty relaxing cervical spine paraspinals and thoracic musculature during manual therapy today. Patient able to progress dynamic scapular and cervical spine exercises today with resistance band shoulder variations, good performance with verbal cueing required. Pt also responded well  to manual cervical traction this date. Patient would continue to benefit from skilled physical therapy for increased cervical ROM, increased strength of scapular and postural musculature, and improved awareness of correct posturing for improved quality of life, improved performance driving and continued progress towards therapy goals.    Eval:Patient is a 64 y.o. female who was seen today for physical therapy evaluation and treatment for M54.2 (ICD-10-CM) - Cervicalgia. Patient demonstrates decreased strength, ROM restriction, reduced flexibility, increased tenderness to palpation and postural abnormalities which are likely contributing to symptoms of pain and are negatively impacting patient ability to perform ADLs. Patient will benefit from skilled physical therapy services to address these deficits to reduce pain and improve level of function with ADLs  OBJECTIVE IMPAIRMENTS: decreased activity tolerance, decreased ROM, decreased strength, postural dysfunction, and pain.   ACTIVITY LIMITATIONS: carrying, lifting, sleeping, and reach over head  PARTICIPATION LIMITATIONS: meal prep, cleaning, laundry, driving, and community activity  REHAB POTENTIAL: Good  CLINICAL DECISION MAKING: Evolving/moderate complexity  EVALUATION COMPLEXITY: Moderate   GOALS: Goals reviewed with patient? No  SHORT TERM GOALS: Target date: 03/04/2024  patient will be independent with initial HEP and compliant with HEP 3-4 times a week   Baseline:  Goal status: in progress  2.  Patient will report 50% improvement overall  Baseline:  Goal status: in progress   LONG TERM GOALS: Target date: 03/18/2024  Patient will be independent in self management strategies to improve quality of life and functional outcomes.  Baseline:  Goal status: in progress  2.  Patient will report 70% improvement overall  Baseline:  Goal status: in progress  3.  Patient will improve NDI score by 6 points to demonstrate  improved perceived function  Baseline: 18/50 Goal status: in progress  4.  Patient will increase cervical mobility by 20 degrees throughout to improve ability to scan for safety with driving  Baseline: see above Goal status: in progress  PLAN:  PT FREQUENCY: 2x/week  PT DURATION: 4 weeks  PLANNED INTERVENTIONS: 97164- PT Re-evaluation, 97110-Therapeutic exercises, 97530- Therapeutic activity, 97112- Neuromuscular re-education, 97535- Self Care, 02859- Manual therapy, [id number]- Gait training, [phone]- Orthotic Fit/training, [phone]- Canalith repositioning, [id number]- Aquatic Therapy, 97760- Splinting, [phone]- Wound care (first 20 sq cm), 97598- Wound care (each additional 20 sq cm)Patient/Family education, Balance training, Stair training, Taping, Dry Needling, Joint mobilization, Joint manipulation, Spinal manipulation, Spinal mobilization, Scar mobilization, and DME instructions.   PLAN FOR NEXT SESSION:  cervical mobility; postural strengthening; STM; manual as needed; manual traction   Lang Ada, PT, DPT [facility] Office: [phone] 3:01 PM, 03/10/2024        "

## 2024-03-13 ENCOUNTER — Ambulatory Visit (HOSPITAL_COMMUNITY)

## 2024-03-17 ENCOUNTER — Ambulatory Visit (HOSPITAL_COMMUNITY)

## 2024-03-17 DIAGNOSIS — M436 Torticollis: Secondary | ICD-10-CM

## 2024-03-17 DIAGNOSIS — M542 Cervicalgia: Secondary | ICD-10-CM | POA: Diagnosis not present

## 2024-03-17 NOTE — Therapy (Signed)
 " OUTPATIENT PHYSICAL THERAPY CERVICAL TREATMENT   Patient Name: MONETTA LICK MRN: [id number] DOB:05/10/1960, 64 y.o., female Today's Date: 03/17/2024  END OF SESSION:  PT End of Session - 03/17/24 1327     Visit Number 4    Number of Visits 8    Date for Recertification  03/18/24    Authorization Type medicare    Authorization Time Period no auth needed    Progress Note Due on Visit 10    PT Start Time 1328    PT Stop Time 1410    PT Time Calculation (min) 42 min    Activity Tolerance Patient tolerated treatment well    Behavior During Therapy WFL for tasks assessed/performed           Past Medical History:  Diagnosis Date   Anxiety    Arthritis    Depression    Dysrhythmia    palpitations   Hypothyroidism    Past Surgical History:  Procedure Laterality Date   CARDIAC CATHETERIZATION  2009   TOTAL KNEE ARTHROPLASTY Left 12/08/2013   Procedure: LEFT TOTAL KNEE ARTHROPLASTY;  Surgeon: Donnice JONETTA Car, MD;  Location: [facility];  Service: Orthopedics;  Laterality: Left;   TOTAL KNEE ARTHROPLASTY Right 08/28/2016   Procedure: RIGHT TOTAL KNEE ARTHROPLASTY;  Surgeon: Car Donnice, MD;  Location: [facility];  Service: Orthopedics;  Laterality: Right;   Patient Active Problem List   Diagnosis Date Noted   Pericardial cyst 01/17/2024   Palpitations 01/17/2024   HLD (hyperlipidemia) 01/17/2024   S/P right TKA 08/28/2016   Primary osteoarthritis of left knee 01/01/2014   Knee stiffness 01/01/2014   Knee pain, left 01/01/2014   Difficulty walking 01/01/2014   S/P left TKA 12/08/2013    PCP: Teresa Jenkins Jansky FNP  REFERRING PROVIDER: Onetha Kuba, MD  REFERRING DIAG: M54.2 (ICD-10-CM) - Cervicalgia  THERAPY DIAG:  Neck pain  Neck stiffness  Rationale for Evaluation and Treatment: Habilitation  ONSET DATE: 1 year or so  SUBJECTIVE:                                                                                                                                                                                                          SUBJECTIVE STATEMENT: Neck doing ok today; 3-4/10 pain on arrival.  Felt bad when she first got up but took a hot shower and did her exercises and that helped.    Eval:Neck pain and UE pain for a while; pain goes up to the back of her neck toward head and ears; also has some  pain in arms; PCP sent to Dr. Onetha who referred to physical therapy Hand dominance: Right  PERTINENT HISTORY:  Bilateral TKA's Heart palpitations Nov of this year Anxiety Osteoporosis per patient  PAIN:  Are you having pain? Yes: NPRS scale: 4/10 Pain location: neck Pain description: dull ache Aggravating factors: turning head Relieving factors: lay down, heat  PRECAUTIONS: Fall  RED FLAGS: None     WEIGHT BEARING RESTRICTIONS: No  FALLS:  Has patient fallen in last 6 months? Yes. Number of falls tripped and fell a few times; working in the garden  OCCUPATION: retired from [employer]  PLOF: Independent  PATIENT GOALS: make pain go away  NEXT MD VISIT: PRN  OBJECTIVE:  Note: Objective measures were completed at Evaluation unless otherwise noted.  DIAGNOSTIC FINDINGS:  IMPRESSION: 1. No acute findings or clear explanation for the patient's symptoms. 2. Multilevel cervical spondylosis, most advanced at C5-6 and C6-7 where there is mild spinal stenosis and moderate osseous foraminal narrowing bilaterally. 3. Asymmetric left-sided facet hypertrophy at C3-4 with associated subchondral edema which may contribute to left neck pain. Associated moderate left foraminal narrowing and possible left C4 nerve root encroachment. 4. Asymmetric left-sided facet hypertrophy at C4-5 with mild to moderate left foraminal narrowing. 5. No abnormal cord signal.     Electronically Signed   By: Elsie Perone M.D.   On: 12/15/2023 14:44    PATIENT SURVEYS:  NDI:  NECK DISABILITY INDEX  Date: 02/19/2024 Score                                 Total 18/50; 36%   Minimum Detectable Change (90% confidence): 5 points or 10% points  COGNITION: Overall cognitive status: Within functional limits for tasks assessed  SENSATION: Sometimes N/T in hands; more L than right  POSTURE: rounded shoulders and forward head  PALPATION:    CERVICAL ROM:   Active ROM AROM (deg) eval  Flexion full  Extension 55  Right lateral flexion 21  Left lateral flexion 25  Right rotation 66  Left rotation 58   (Blank rows = not tested)  UPPER EXTREMITY ROM:  Active ROM Right eval Left eval  Shoulder flexion full full  Shoulder extension    Shoulder abduction    Shoulder adduction    Shoulder extension    Shoulder internal rotation    Shoulder external rotation    Elbow flexion    Elbow extension    Wrist flexion    Wrist extension    Wrist ulnar deviation    Wrist radial deviation    Wrist pronation    Wrist supination     (Blank rows = not tested)  UPPER EXTREMITY MMT:  MMT Right eval Left eval  Shoulder flexion 4 4  Shoulder extension    Shoulder abduction    Shoulder adduction    Shoulder extension    Shoulder internal rotation    Shoulder external rotation    Middle trapezius    Lower trapezius    Elbow flexion 4 4+  Elbow extension 4 4+  Wrist flexion    Wrist extension    Wrist ulnar deviation    Wrist radial deviation    Wrist pronation    Wrist supination     thumb extension 4 4+   (Blank rows = not tested)    TREATMENT DATE:  03/18/23 Seated moist heat to cervical spine with cervical rotations periodically x  5' to decrease pain and improve soft tissue extensbility STM to cervical spine and upper traps x 10' to decrease pain and improve soft tissue extensiblity Supine manual traction x 5' to improve joint extensibility UBE forward and back 2' each Scapular retractions red 2 x 10 Thoracic extension x 8   03/10/2024  Education on modalities during MHP to cervical spine, 5  minutes Manual Therapy: -Cervical distractions (forearm and mastoid variations), 5 reps 10 second holds each -Segmental rotation and lateral flexion mobilizations, grade II and III, pt cued for relaxation throughout -Cervical CPA and UPA mobilizations, grade II and III, C2-T7 Therapeutic Exercise: -UBE, 4 minutes, level 3 resistance, pt cued for 60-70 spm -Seated thoracic extensions with towel placed behind back, 1 set of 6 reps, 5 second holds, pt cued for sequencing and UE support for OP -Shoulder extension/rows, 2 set of 10 reps, blue theraband at chest level, pt cued for eccentric control and scapular retraction   03/06/24 Review of HEP and goals Sitting  Moist heat to cervical spine x 5' to decrease pain and improve soft tissue extensibility Upper trap stretch 5 x 20 Levator stretch 5 x 20 Cervical rotation with towel X 5 Each side STM to cervical spine x 10'    02/19/2024 physical therapy evaluation and HEP instruction                                                                                                                                 PATIENT EDUCATION:  Education details: Patient educated on exam findings, POC, scope of PT, HEP, and what to expect next visit. Person educated: Patient Education method: Explanation, Demonstration, and Handouts Education comprehension: verbalized understanding, returned demonstration, verbal cues required, and tactile cues required   HOME EXERCISE PROGRAM: Access Code: [email or web address]  Seated Assisted Cervical Rotation with Towel  - 2 x daily - 7 x weekly - 1 sets - 5 reps Access Code: [email or web address] URL: [email or web address] Date: 02/19/2024 Prepared by: [facility] - Rehab  Exercises - Seated Gentle Upper Trapezius Stretch  - 2 x daily - 7 x weekly - 1 sets - 3 reps - Gentle Levator Scapulae Stretch  - 2 x daily - 7 x weekly - 1 sets - 3 reps  ASSESSMENT:  CLINICAL IMPRESSION: Patient continues to demonstrate decreased  cervical spine ROM, decreased scapular strength, and abnormal posturing. Started with moist heat to decrease pain and improve soft tissue extensibility.  Continued with manual work to improve soft tissue extensibility and joint mobility; there activity for postural strengthening.  Patient with difficulty coordinating scapular retractions as she tends to elevate her shoulders.  Decreased pain to 2/10 after treatment.   Patient would continue to benefit from skilled physical therapy for increased cervical ROM, increased strength of scapular and postural musculature, and improved awareness of correct posturing for improved quality of life, improved performance driving and continued progress towards therapy goals.    Eval:Patient is a 64 y.o.  female who was seen today for physical therapy evaluation and treatment for M54.2 (ICD-10-CM) - Cervicalgia. Patient demonstrates decreased strength, ROM restriction, reduced flexibility, increased tenderness to palpation and postural abnormalities which are likely contributing to symptoms of pain and are negatively impacting patient ability to perform ADLs. Patient will benefit from skilled physical therapy services to address these deficits to reduce pain and improve level of function with ADLs   OBJECTIVE IMPAIRMENTS: decreased activity tolerance, decreased ROM, decreased strength, postural dysfunction, and pain.   ACTIVITY LIMITATIONS: carrying, lifting, sleeping, and reach over head  PARTICIPATION LIMITATIONS: meal prep, cleaning, laundry, driving, and community activity  REHAB POTENTIAL: Good  CLINICAL DECISION MAKING: Evolving/moderate complexity  EVALUATION COMPLEXITY: Moderate   GOALS: Goals reviewed with patient? No  SHORT TERM GOALS: Target date: 03/04/2024  patient will be independent with initial HEP and compliant with HEP 3-4 times a week   Baseline:  Goal status: in progress  2.  Patient will report 50% improvement overall  Baseline:   Goal status: in progress   LONG TERM GOALS: Target date: 03/18/2024  Patient will be independent in self management strategies to improve quality of life and functional outcomes.  Baseline:  Goal status: in progress  2.  Patient will report 70% improvement overall  Baseline:  Goal status: in progress  3.  Patient will improve NDI score by 6 points to demonstrate improved perceived function  Baseline: 18/50 Goal status: in progress  4.  Patient will increase cervical mobility by 20 degrees throughout to improve ability to scan for safety with driving  Baseline: see above Goal status: in progress  PLAN:  PT FREQUENCY: 2x/week  PT DURATION: 4 weeks  PLANNED INTERVENTIONS: 97164- PT Re-evaluation, 97110-Therapeutic exercises, 97530- Therapeutic activity, 97112- Neuromuscular re-education, 97535- Self Care, 02859- Manual therapy, [id number]- Gait training, [phone]- Orthotic Fit/training, [phone]- Canalith repositioning, [id number]- Aquatic Therapy, 97760- Splinting, [phone]- Wound care (first 20 sq cm), 97598- Wound care (each additional 20 sq cm)Patient/Family education, Balance training, Stair training, Taping, Dry Needling, Joint mobilization, Joint manipulation, Spinal manipulation, Spinal mobilization, Scar mobilization, and DME instructions.   PLAN FOR NEXT SESSION:  cervical mobility; postural strengthening; STM; manual as needed; manual traction   2:13 PM, 03/17/24 [place] Rorie Small Marvelous [place] MPT [facility] [place] [phone] [phone]       "

## 2024-03-20 ENCOUNTER — Ambulatory Visit (HOSPITAL_COMMUNITY)

## 2024-03-27 ENCOUNTER — Ambulatory Visit: Admitting: Surgery

## 2024-03-27 ENCOUNTER — Encounter: Payer: Self-pay | Admitting: Surgery

## 2024-03-27 VITALS — BP 111/78 | HR 63 | Temp 97.9°F | Resp 16 | Ht 65.0 in | Wt 161.0 lb

## 2024-03-27 DIAGNOSIS — Z1211 Encounter for screening for malignant neoplasm of colon: Secondary | ICD-10-CM

## 2024-03-27 DIAGNOSIS — R1030 Lower abdominal pain, unspecified: Secondary | ICD-10-CM

## 2024-03-27 DIAGNOSIS — R197 Diarrhea, unspecified: Secondary | ICD-10-CM

## 2024-03-27 MED ORDER — SUTAB 1479-225-188 MG PO TABS: ORAL_TABLET | ORAL | 0 refills | Status: AC

## 2024-03-27 NOTE — Progress Notes (Signed)
 [facility] History and Physical  Reason for Referral: Colonoscopy Referring Physician: Jenkins Pizza, FNP  Chief Complaint   New Patient (Initial Visit)     Teresa Charles is a 64 y.o. female.  HPI: Patient presents for screening colonoscopy.  She states that she has never had a colonoscopy in the past.  She complains of lower abdominal pain for the last 6 months that seems to be getting worse with eating chocolate.  The pain comes on about 2 hours after eating.  She also complains of having loose bowel movements during this timeframe, and she is having 2-3 bowel movements per day.  She denies noting any blood in her bowel movements.  She has not tried taking any medications to help with her pain or to help with her loose bowel movements.  She states that her pain can also be worse with palpation.  She denies talking to her primary care doctor about the symptoms, but they have progressively gotten worse since her follow-up visit.  She denies any family history of colorectal cancer.  She does have a brother who was diagnosed with lung cancer.  Her past medical history is significant for depression, hyperlipidemia, hypothyroidism, and pericardial cyst.  She denies use of blood thinning medications.  Her surgical history is significant for bilateral knee replacements.  She denies any history of abdominal surgeries.  She quit smoking about 5 years ago, but does smoke THC daily.  She will also consume THC edibles.  She denies use of alcohol.  Past Medical History:  Diagnosis Date   Anxiety    Arthritis    Depression    Dysrhythmia    palpitations   Hypothyroidism     Past Surgical History:  Procedure Laterality Date   CARDIAC CATHETERIZATION  2009   TOTAL KNEE ARTHROPLASTY Left 12/08/2013   Procedure: LEFT TOTAL KNEE ARTHROPLASTY;  Surgeon: Donnice JONETTA Car, MD;  Location: [facility];  Service: Orthopedics;  Laterality: Left;   TOTAL KNEE ARTHROPLASTY Right 08/28/2016   Procedure:  RIGHT TOTAL KNEE ARTHROPLASTY;  Surgeon: Car Donnice, MD;  Location: [facility];  Service: Orthopedics;  Laterality: Right;    Family History  Problem Relation Age of Onset   Cancer Brother     Social History[1]  Medications: I have reviewed the patient's current medications. Allergies as of 03/27/2024   No Known Allergies      Medication List        Accurate as of March 27, 2024  1:15 PM. If you have any questions, ask your nurse or doctor.          STOP taking these medications    aspirin  81 MG chewable tablet Stopped by: Dorothyann Hodaya Curto, DO   benzonatate  100 MG capsule Commonly known as: TESSALON  Stopped by: Dorothyann Anthonny Schiller, DO   cephALEXin  500 MG capsule Commonly known as: KEFLEX  Stopped by: Dorothyann Brittle, DO       TAKE these medications    acetaminophen  500 MG tablet Commonly known as: TYLENOL  Take 2 tablets (1,000 mg total) by mouth every 8 (eight) hours.   ALPRAZolam 0.5 MG tablet Commonly known as: XANAX Take 1 mg by mouth at bedtime as needed for anxiety or sleep.   amoxicillin  500 MG capsule Commonly known as: AMOXIL  4 capsules Orally 1 hour before dental procedures   atorvastatin 10 MG tablet Commonly known as: LIPITOR Take 10 mg by mouth daily.   buPROPion 300 MG 24 hr tablet Commonly known as: WELLBUTRIN XL Take 300 mg  by mouth daily.   escitalopram 20 MG tablet Commonly known as: LEXAPRO Take 20 mg by mouth daily.   levothyroxine 112 MCG tablet Commonly known as: SYNTHROID Take 112 mcg by mouth daily before breakfast.   methocarbamol  500 MG tablet Commonly known as: Robaxin  Take 1 tablet (500 mg total) by mouth every 6 (six) hours as needed for muscle spasms.   metoprolol tartrate 25 MG tablet Commonly known as: LOPRESSOR Take 25 mg by mouth daily.   valACYclovir 500 MG tablet Commonly known as: VALTREX Take 500 mg by mouth daily as needed.         ROS:  Constitutional: negative for chills,  fatigue, and fevers Eyes: negative for visual disturbance and pain Ears, nose, mouth, throat, and face: positive for sore throat and sinus problems, negative for ear drainage Respiratory: positive for cough and shortness of breath, negative for wheezing Cardiovascular: negative for chest pain and palpitations Gastrointestinal: positive for abdominal pain and nausea, negative for reflux symptoms and vomiting Genitourinary:negative for dysuria and frequency Integument/breast: negative for dryness and rash Hematologic/lymphatic: negative for easy bruising and lymphadenopathy Musculoskeletal:positive for back pain and neck pain Neurological: negative for dizziness and tremors Endocrine: negative for temperature intolerance  Blood pressure 111/78, pulse 63, temperature 97.9 F (36.6 C), temperature source Other (Comment), resp. rate 16, height 5' 5 (1.651 m), weight 161 lb (73 kg), SpO2 96%. Physical Exam Vitals reviewed.  Constitutional:      Appearance: Normal appearance.  HENT:     Head: Normocephalic and atraumatic.  Eyes:     Pupils: Pupils are equal, round, and reactive to light.  Cardiovascular:     Rate and Rhythm: Normal rate and regular rhythm.  Pulmonary:     Effort: Pulmonary effort is normal.     Breath sounds: Normal breath sounds.  Abdominal:     Comments: Abdomen soft, nondistended, no percussion tenderness, nontender to palpation; no rigidity, guarding, rebound tenderness  Musculoskeletal:        General: Normal range of motion.     Cervical back: Normal range of motion.  Skin:    General: Skin is warm and dry.  Neurological:     General: No focal deficit present.     Mental Status: She is alert and oriented to person, place, and time.  Psychiatric:        Mood and Affect: Mood normal.        Behavior: Behavior normal.     Results: No results found for this or any previous visit (from the past 48 hours).  No results found.   Assessment & Plan:  Teresa Charles is a 64 y.o. female who presents for evaluation of screening colonoscopy.  -We discussed methods for screening for colon cancer, and the role colonoscopy plays in screening for colon cancer -The risk and benefits of colonoscopy were discussed including but not limited to bleeding, infection, missed lesions, and perforation of the colon requiring surgery.  After careful consideration, Khushboo Chuck has decided to proceed with colonoscopy.  -Patient tentatively scheduled for colonoscopy on 2/17 -Prescription provided to the patient for Sutab  prep, as well as an instruction sheet on how to appropriately administer the prep. -Coupon also provided to the patient for Sutab  prep -Information provided to the patient regarding colonoscopy and Sutab  prep -Advised that if she has no significant abnormalities noted on colonoscopy, will plan to refer to GI for evaluation for IBS or other source of abdominal pain/diarrhea  All questions were answered to the  satisfaction of the patient.  Note: Portions of this report may have been transcribed using voice recognition software. Every effort has been made to ensure accuracy; however, inadvertent computerized transcription errors may still be present.   Dorothyann Brittle, DO [facility] [address] [place], [place] [address] [phone] (office)          [1]  Social History Tobacco Use   Smoking status: Former    Current packs/day: 1.00    Average packs/day: 1 pack/day for 35.0 years (35.0 ttl pk-yrs)    Types: Cigarettes   Smokeless tobacco: Never  Vaping Use   Vaping status: Every Day   Start date: 08/25/2014  Substance Use Topics   Alcohol use: No   Drug use: No

## 2024-03-28 ENCOUNTER — Ambulatory Visit (HOSPITAL_COMMUNITY)

## 2024-03-31 ENCOUNTER — Ambulatory Visit (HOSPITAL_COMMUNITY)

## 2024-04-09 ENCOUNTER — Ambulatory Visit (HOSPITAL_COMMUNITY)

## 2024-04-15 ENCOUNTER — Encounter (HOSPITAL_COMMUNITY): Payer: Self-pay

## 2024-04-15 ENCOUNTER — Ambulatory Visit (HOSPITAL_COMMUNITY): Admit: 2024-04-15 | Admitting: Surgery
